# Patient Record
Sex: Female | Born: 1970 | Race: White | Hispanic: No | Smoking: Former smoker
Health system: Southern US, Community
[De-identification: ages and names within clinical notes are randomized; demographics above are authoritative.]

## PROBLEM LIST (undated history)

## (undated) DIAGNOSIS — G43909 Migraine, unspecified, not intractable, without status migrainosus: Secondary | ICD-10-CM

## (undated) DIAGNOSIS — R609 Edema, unspecified: Secondary | ICD-10-CM

## (undated) HISTORY — PX: TUBAL LIGATION: SHX77

## (undated) HISTORY — DX: Migraine, unspecified, not intractable, without status migrainosus: G43.909

---

## 2003-07-23 ENCOUNTER — Other Ambulatory Visit: Admission: RE | Admit: 2003-07-23 | Discharge: 2003-07-23 | Payer: Self-pay | Admitting: *Deleted

## 2006-07-07 ENCOUNTER — Emergency Department (HOSPITAL_COMMUNITY): Admission: EM | Admit: 2006-07-07 | Discharge: 2006-07-08 | Payer: Self-pay | Admitting: Emergency Medicine

## 2006-07-08 ENCOUNTER — Ambulatory Visit: Payer: Self-pay

## 2006-07-24 ENCOUNTER — Ambulatory Visit: Payer: Self-pay | Admitting: Cardiology

## 2006-08-06 ENCOUNTER — Encounter: Payer: Self-pay | Admitting: Cardiology

## 2006-08-06 ENCOUNTER — Ambulatory Visit: Payer: Self-pay

## 2006-08-19 ENCOUNTER — Ambulatory Visit: Payer: Self-pay | Admitting: Cardiology

## 2006-10-03 ENCOUNTER — Ambulatory Visit: Payer: Self-pay | Admitting: Cardiology

## 2009-09-22 ENCOUNTER — Ambulatory Visit (HOSPITAL_COMMUNITY): Admission: RE | Admit: 2009-09-22 | Discharge: 2009-09-22 | Payer: Self-pay | Admitting: Obstetrics and Gynecology

## 2011-01-09 LAB — HCG, SERUM, QUALITATIVE: Preg, Serum: NEGATIVE

## 2011-01-09 LAB — CBC
MCV: 91.6 fL (ref 78.0–100.0)
Platelets: 226 10*3/uL (ref 150–400)
RDW: 12.6 % (ref 11.5–15.5)
WBC: 7.4 10*3/uL (ref 4.0–10.5)

## 2011-02-23 NOTE — Assessment & Plan Note (Signed)
[facility]                              CARDIOLOGY OFFICE NOTE   ETHERINE, MACKOWIAK                       MRN:          [id number]  DATE:08/19/2006                            DOB:          01/15/1971    Helen Shannon is a pleasant, 40 year old female who I recently saw for  palpitations.  She had an event monitor that showed normal sinus rhythm with  PVCs.  TSH and magnesium were normal.  An echocardiogram was performed on  August 06, 2006.  Her LV function was normal and there was mild biatrial  enlargement.  We placed the patient on Toprol 25 mg p.o. daily, however, she  contacted [place] and stated that she felt hyper on the Toprol and was unable to  sleep due to her excess energy levels.  We stopped her Toprol and began  atenolol.  She feels that that may be increasing her blood pressure.  However, her palpitations have improved.  There is no dyspnea or exertional  chest pain.   Her medications include Prevacid 30 mg p.o. daily and Atenolol 25 mg p.o.  daily.   PHYSICAL EXAM:  Shows a blood pressure of 132/86 and her pulse is 70.  CHEST:  Clear  CARDIOVASCULAR:  Regular rate and rhythm.  EXTREMITIES:  No edema.   DIAGNOSIS:  Palpitations secondary to PVCs.   PLAN:  [name] continues to have palpitations but they are improved on  atenolol.  However, she is very concerned about atenolol causing high blood  pressure.  I explained to her that this typically is not the case and that  it is indeed used for an antihypertensive.  She was hesitant but we agreed  to increase her atenolol to 50 mg p.o. daily to see if her symptoms would  improve.  She will monitor her blood pressure at home.  If she develops  worsening side effects, then we could consider discontinuing her atenolol  and trying Cardizem 120 mg p.o. daily.  We will see her back in  approximately 6 weeks.     Madolyn Frieze Jens Som, MD, [facility]  Electronically Signed    BSC/MedQ  DD:  08/19/2006  DT: 08/19/2006  Job #: [id number]   cc:   Sheppard Penton. Stacie Acres, M.D.

## 2011-02-23 NOTE — Assessment & Plan Note (Signed)
[facility]                            CARDIOLOGY OFFICE NOTE   Helen Shannon, Helen Shannon                       MRN:          [id number]  DATE:10/03/2006                            DOB:          08-18-1971    Helen Shannon returns for followup today. Please refer to my previous  notes for details. Since I last saw her, she continues to have  occasional palpitations that are described as a brief skip with a mild  pain. There is no other dyspnea on exertion, orthopnea, PND, pedal  edema, exertional chest pain or syncope. Note she did increase her  atenolol to 50 mg p.o. daily but apparently did not tolerate this as she  felt increased fatigue and shortness of breath.   CURRENT MEDICATIONS:  1. Prevacid 30 mg p.o. daily.  2. Atenolol 25 mg p.o. daily.   PHYSICAL EXAMINATION:  VITAL SIGNS:  Blood pressure 128/82 and her pulse  is 68.  NECK:  Supple.  CHEST:  Clear.  CARDIOVASCULAR:  Reveals a regular rate and rhythm.  EXTREMITIES:  Show no edema.   DIAGNOSES:  Palpitations secondary to premature ventricular  contractions.   PLAN:  Helen Shannon continues to have palpitations that are not improved  with the atenolol. She states she did not tolerate the higher dose of  atenolol and has now failed that as well as Toprol. We discussed trying  another beta blocker today versus a calcium blocker and we have decided  to see if Cardizem 120 mg p.o. daily will provide any benefit. She will  discontinue her atenolol. I will see her back in approximately 4-6  weeks.     Madolyn Frieze Jens Som, MD, [facility]  Electronically Signed    BSC/MedQ  DD: 10/03/2006  DT: 10/03/2006  Job #: [id number]
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
## 2011-11-29 ENCOUNTER — Encounter (HOSPITAL_COMMUNITY): Payer: Self-pay

## 2011-11-29 ENCOUNTER — Other Ambulatory Visit: Payer: Self-pay

## 2011-11-29 ENCOUNTER — Emergency Department (HOSPITAL_COMMUNITY): Payer: BC Managed Care – PPO

## 2011-11-29 ENCOUNTER — Emergency Department (HOSPITAL_COMMUNITY)
Admission: EM | Admit: 2011-11-29 | Discharge: 2011-11-29 | Disposition: A | Discharge status: Home / Self Care | Payer: BC Managed Care – PPO | Attending: Emergency Medicine | Admitting: Emergency Medicine

## 2011-11-29 DIAGNOSIS — M79609 Pain in unspecified limb: Secondary | ICD-10-CM | POA: Insufficient documentation

## 2011-11-29 DIAGNOSIS — M79603 Pain in arm, unspecified: Secondary | ICD-10-CM

## 2011-11-29 DIAGNOSIS — M549 Dorsalgia, unspecified: Secondary | ICD-10-CM | POA: Insufficient documentation

## 2011-11-29 DIAGNOSIS — M542 Cervicalgia: Secondary | ICD-10-CM | POA: Insufficient documentation

## 2011-11-29 DIAGNOSIS — F17200 Nicotine dependence, unspecified, uncomplicated: Secondary | ICD-10-CM | POA: Insufficient documentation

## 2011-11-29 HISTORY — DX: Edema, unspecified: R60.9

## 2011-11-29 LAB — POCT I-STAT TROPONIN I

## 2011-11-29 MED ORDER — NAPROXEN 500 MG PO TABS: 500.0000 mg | ORAL_TABLET | Freq: Two times a day (BID) | ORAL | Status: AC

## 2011-11-29 MED ORDER — FENTANYL CITRATE 0.05 MG/ML IJ SOLN
50.0000 ug | Freq: Once | INTRAMUSCULAR | Status: AC
  Administered 2011-11-29: 50 ug via INTRAMUSCULAR
  Filled 2011-11-29: qty 2

## 2011-11-29 MED ORDER — DIAZEPAM 5 MG PO TABS: 5.0000 mg | ORAL_TABLET | Freq: Two times a day (BID) | ORAL | Status: AC

## 2011-11-29 MED ORDER — DIAZEPAM 5 MG PO TABS
5.0000 mg | ORAL_TABLET | Freq: Once | ORAL | Status: AC
  Administered 2011-11-29: 5 mg via ORAL
  Filled 2011-11-29: qty 1

## 2011-11-29 MED ORDER — KETOROLAC TROMETHAMINE 60 MG/2ML IM SOLN
60.0000 mg | Freq: Once | INTRAMUSCULAR | Status: AC
  Administered 2011-11-29: 60 mg via INTRAMUSCULAR
  Filled 2011-11-29: qty 2

## 2011-11-29 NOTE — ED Notes (Signed)
Pt states that at 0700 she had onset of severe stabbing upper back pain. She states that the pain is radiating down her left arm. She denies any other s/s. She states that the pain is sharp in nature. She took asa pta but is unsure of the dosage. Alert and oriented.

## 2011-11-29 NOTE — ED Notes (Signed)
Pt stated that she was not going to wait for sling she was going to go to another hospital and get one.

## 2011-11-29 NOTE — Discharge Instructions (Signed)
Please followup with your primary care Dr. or with an orthopedic Dr. for further evaluation of your symptoms. Take medication as prescribed do not drive or operate heavy machinery as there can cause drowsiness. Return to ER if you are having fever, shortness of breath, or chest pain.  RICE: Routine Care for Injuries The routine care of many injuries includes Rest, Ice, Compression, and Elevation (RICE). HOME CARE INSTRUCTIONS  Rest is needed to allow your body to heal. Routine activities can usually be resumed when comfortable. Injured tendons and bones can take up to 6 weeks to heal. Tendons are the cord-like structures that attach muscle to bone.   Ice following an injury helps keep the swelling down and reduces pain.   Put ice in a plastic bag.   Place a towel between your skin and the bag.   Leave the ice on for 15 to 20 minutes, 3 to 4 times a day. Do this while awake, for the first 24 to 48 hours. After that, continue as directed by your caregiver.   Compression helps keep swelling down. It also gives support and helps with discomfort. If an elastic bandage has been applied, it should be removed and reapplied every 3 to 4 hours. It should not be applied tightly, but firmly enough to keep swelling down. Watch fingers or toes for swelling, bluish discoloration, coldness, numbness, or excessive pain. If any of these problems occur, remove the bandage and reapply loosely. Contact your caregiver if these problems continue.   Elevation helps reduce swelling and decreases pain. With extremities, such as the arms, hands, legs, and feet, the injured area should be placed near or above the level of the heart, if possible.  SEEK IMMEDIATE MEDICAL CARE IF:  You have persistent pain and swelling.   You develop redness, numbness, or unexpected weakness.   Your symptoms are getting worse rather than improving after several days.  These symptoms may indicate that further evaluation or further X-rays  are needed. Sometimes, X-rays may not show a small broken bone (fracture) until 1 week or 10 days later. Make a follow-up appointment with your caregiver. Ask when your X-ray results will be ready. Make sure you get your X-ray results. Document Released: 01/06/2001 Document Revised: 06/06/2011 Document Reviewed: 02/23/2011 [facility] Patient Information 2012 [place], [place].

## 2011-11-29 NOTE — Progress Notes (Signed)
Orthopedic Tech Progress Note Patient Details:  Helen Shannon 09-20-1971 [id number]  Other Ortho Devices Ortho Device Location: arm sling Ortho Device Interventions: Application   Cammer, Mickie Bail 11/29/2011, 1:49 PM

## 2011-11-29 NOTE — ED Notes (Signed)
Patient discharged with prescriptions and written instructions

## 2011-11-29 NOTE — ED Provider Notes (Signed)
History     CSN: [id number]  Arrival date & time 11/29/11  1030   First MD Initiated Contact with Patient 11/29/11 1142      Chief Complaint  Patient presents with  . Back Pain    (Consider location/radiation/quality/duration/timing/severity/associated sxs/prior treatment) HPI  41 year old female presents with acute onset of severe stabbing upper back pain which started this morning. Pain is to the mid back radiating to her shoulder blade, and down her left arm. Patient noticed increasing pain if she has her left arm down, and pain improves if she raises her arm up above her head. Pain is persistent. She denies similar pain in the past. She denies any recent strenuous exercise. She denies fever, headache, chest pain, shortness of breath, abdominal pain. She denies any rash or any recent trauma.  Past Medical History  Diagnosis Date  . Fluid retention     History reviewed. No pertinent past surgical history.  History reviewed. No pertinent family history.  History  Substance Use Topics  . Smoking status: Current Everyday Smoker -- 0.5 packs/day  . Smokeless tobacco: Not on file  . Alcohol Use: No    OB History    Grav Para Term Preterm Abortions TAB SAB Ect Mult Living                  Review of Systems  All other systems reviewed and are negative.    Allergies  Codeine  Home Medications   Current Outpatient Rx  Name Route Sig Dispense Refill  . ASPIRIN 325 MG PO TABS Oral Take 325 mg by mouth once. For pain.    Marland Kitchen HYDROCHLOROTHIAZIDE 25 MG PO TABS Oral Take 25 mg by mouth daily.    . IBUPROFEN 200 MG PO TABS Oral Take 400-800 mg by mouth every 6 (six) hours as needed. For pain.      BP 94/62  Pulse 90  Temp(Src) 98 F (36.7 C) (Oral)  Resp 20  SpO2 100%  Physical Exam  Nursing note and vitals reviewed. Constitutional: She appears well-developed and well-nourished.       Morbidly obese patient, appears tearful.  HENT:  Head: Normocephalic and  atraumatic.  Eyes: Conjunctivae are normal.  Neck: Normal range of motion. Neck supple.       No significant pain with axial loading.  Cardiovascular: Normal rate, regular rhythm and normal heart sounds.   Pulmonary/Chest: Effort normal and breath sounds normal. No respiratory distress. She exhibits no tenderness.       Distant breath sounds, likely secondary to body habitus.  Abdominal: Soft.  Musculoskeletal:       Right shoulder: Normal.       Left shoulder: She exhibits decreased range of motion and tenderness. She exhibits no bony tenderness, no swelling, no effusion and no deformity.       Right elbow: Normal.      Left elbow: Normal.       Cervical back: Normal.       Thoracic back: Normal.       Lumbar back: Normal.    ED Course  Procedures (including critical care time)  Labs Reviewed - No data to display No results found.   No diagnosis found.   Date: 11/29/2011  Rate: 92  Rhythm: normal sinus rhythm  QRS Axis: normal  Intervals: normal  ST/T Wave abnormalities: normal  Conduction Disutrbances:none  Narrative Interpretation:   Old EKG Reviewed: none available    MDM  Patient with mid upper back pain,  with no focal point tenderness. Pain seems to worsen when her left arm is down and improves with arm raise. Her left shoulder evaluation was unremarkable. She does not have any significant neck pain. She denies chest pain or shortness of breath. Her pain is likely musculoskeletal, as it is reproducible with arm movement. Will also consider cardiac, pulmonary etiology. Aortic dissection, pneumonia, PE, pneumothorax, ACS are less likely. Pt is PERC negative.  Will obtain EKG, troponin, chest x-ray. Will give patient Toradol, and muscle relaxant.  12:58 PM ECG is unremarkable.  CXR shows no acute finding.  However, pt sts her pain has not improved with toradol and valium.  She is allergic to Codeine, Fentanyl IM given.  I discussed care with my attending who  agrees with plan.        Fayrene Helper, PA-C 11/29/11 1331

## 2011-11-29 NOTE — ED Notes (Signed)
Ortho paged. 

## 2011-12-03 NOTE — ED Provider Notes (Signed)
History/physical exam/procedure(s) were performed by non-physician practitioner and as supervising physician I was immediately available for consultation/collaboration. I have reviewed all notes and am in agreement with care and plan.   Hilario Quarry, MD 12/03/11 [phone]

## 2012-08-25 ENCOUNTER — Ambulatory Visit: Payer: BC Managed Care – PPO | Attending: Orthopedic Surgery | Admitting: Physical Therapy

## 2012-08-25 DIAGNOSIS — R5381 Other malaise: Secondary | ICD-10-CM | POA: Insufficient documentation

## 2012-08-25 DIAGNOSIS — M25669 Stiffness of unspecified knee, not elsewhere classified: Secondary | ICD-10-CM | POA: Insufficient documentation

## 2012-08-25 DIAGNOSIS — IMO0001 Reserved for inherently not codable concepts without codable children: Secondary | ICD-10-CM | POA: Insufficient documentation

## 2012-08-25 DIAGNOSIS — M25569 Pain in unspecified knee: Secondary | ICD-10-CM | POA: Insufficient documentation

## 2012-08-29 ENCOUNTER — Ambulatory Visit: Payer: BC Managed Care – PPO | Admitting: Physical Therapy

## 2012-09-02 ENCOUNTER — Ambulatory Visit: Payer: BC Managed Care – PPO | Admitting: *Deleted

## 2012-09-08 ENCOUNTER — Ambulatory Visit: Payer: BC Managed Care – PPO | Attending: Orthopedic Surgery | Admitting: Physical Therapy

## 2012-09-08 DIAGNOSIS — IMO0001 Reserved for inherently not codable concepts without codable children: Secondary | ICD-10-CM | POA: Insufficient documentation

## 2012-09-08 DIAGNOSIS — M25669 Stiffness of unspecified knee, not elsewhere classified: Secondary | ICD-10-CM | POA: Insufficient documentation

## 2012-09-08 DIAGNOSIS — M25569 Pain in unspecified knee: Secondary | ICD-10-CM | POA: Insufficient documentation

## 2012-09-08 DIAGNOSIS — R5381 Other malaise: Secondary | ICD-10-CM | POA: Insufficient documentation

## 2012-09-11 ENCOUNTER — Ambulatory Visit: Payer: BC Managed Care – PPO | Admitting: Physical Therapy

## 2012-09-17 ENCOUNTER — Ambulatory Visit: Payer: BC Managed Care – PPO | Admitting: Physical Therapy

## 2012-09-22 ENCOUNTER — Ambulatory Visit: Payer: BC Managed Care – PPO | Admitting: Physical Therapy

## 2012-09-26 ENCOUNTER — Encounter: Payer: BC Managed Care – PPO | Admitting: Physical Therapy

## 2013-02-14 IMAGING — CR DG CHEST 2V
2 series · 2 of 2 positions shown · non-contrast
Comparison: Chest x-ray 07/08/2006.

CLINICAL DATA: Back, neck and left arm pain.

CHEST - 2 VIEW

[w chest pa]
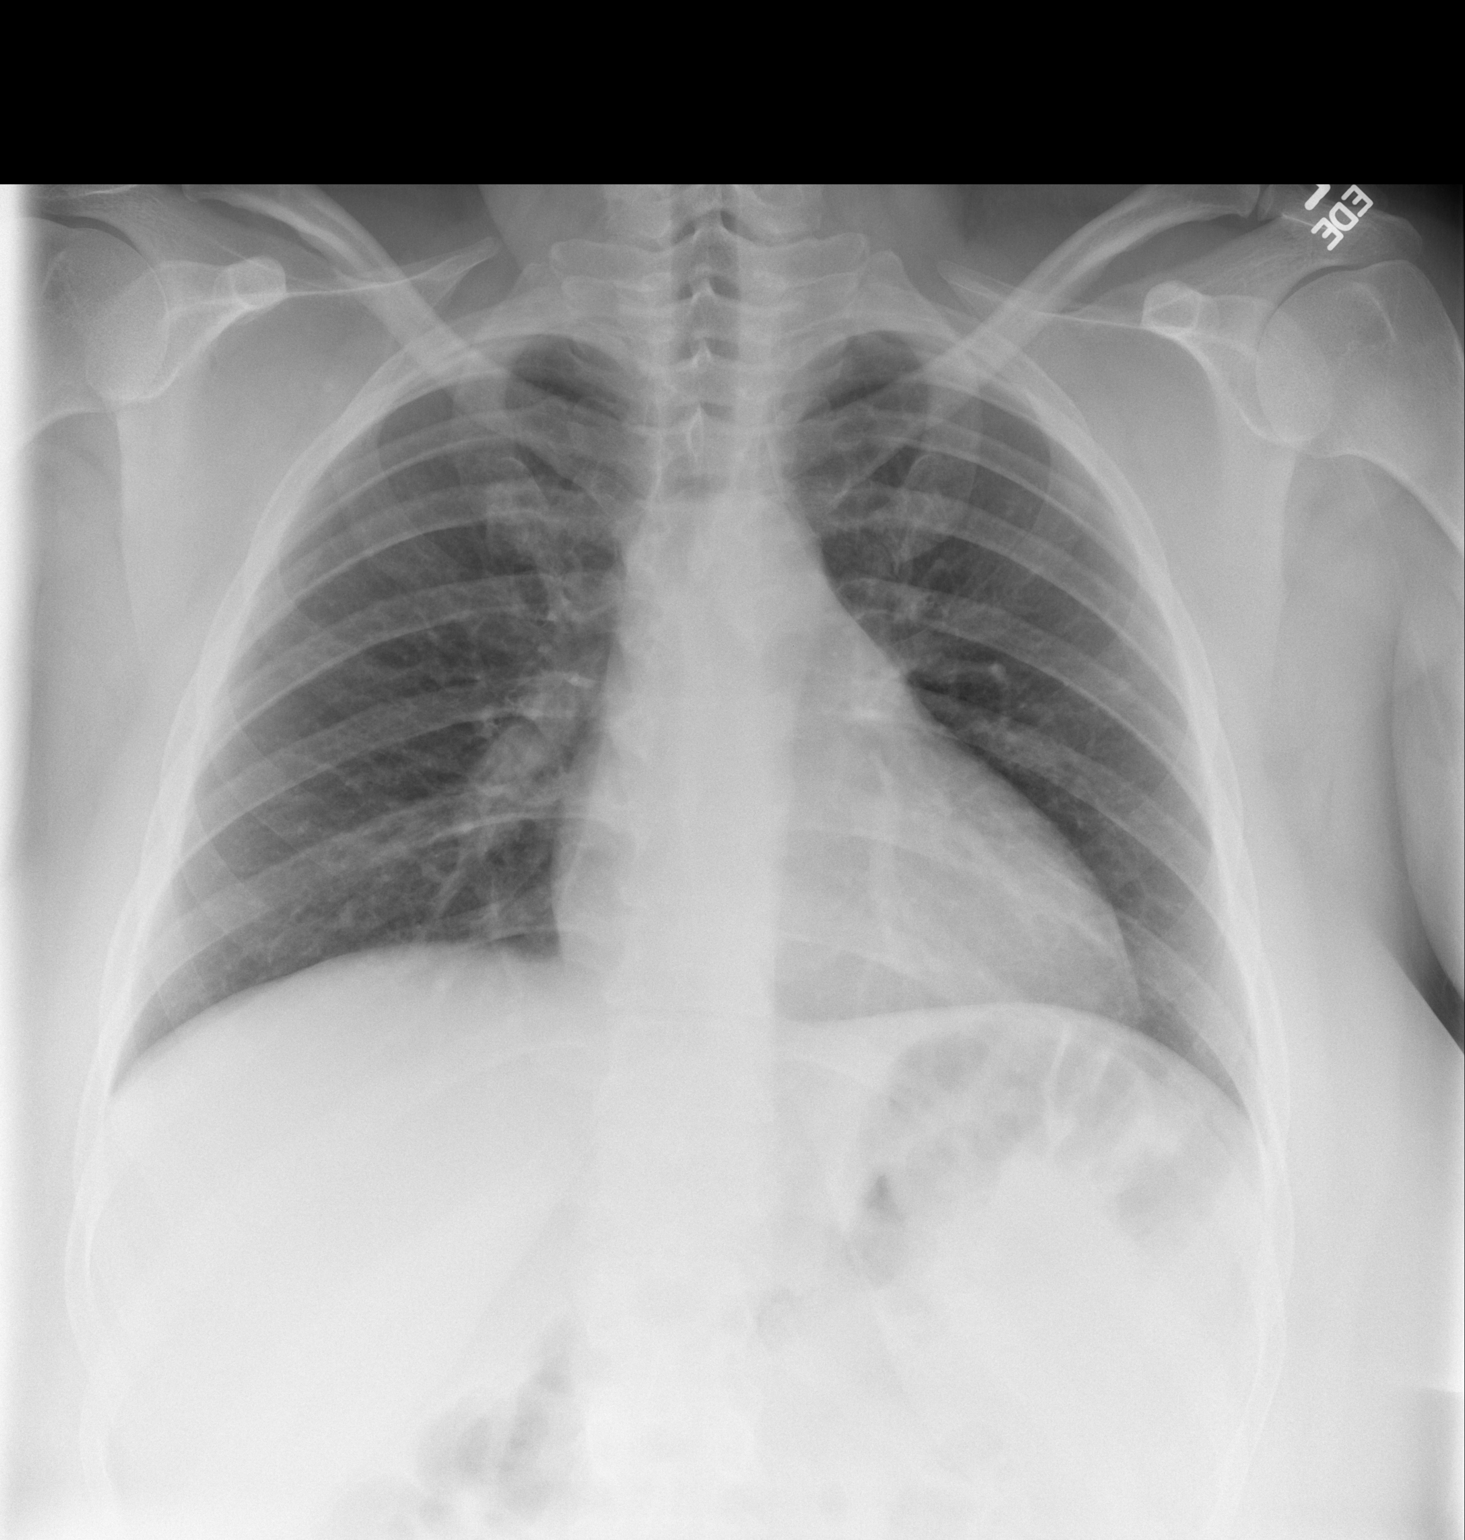

[w chest lat]
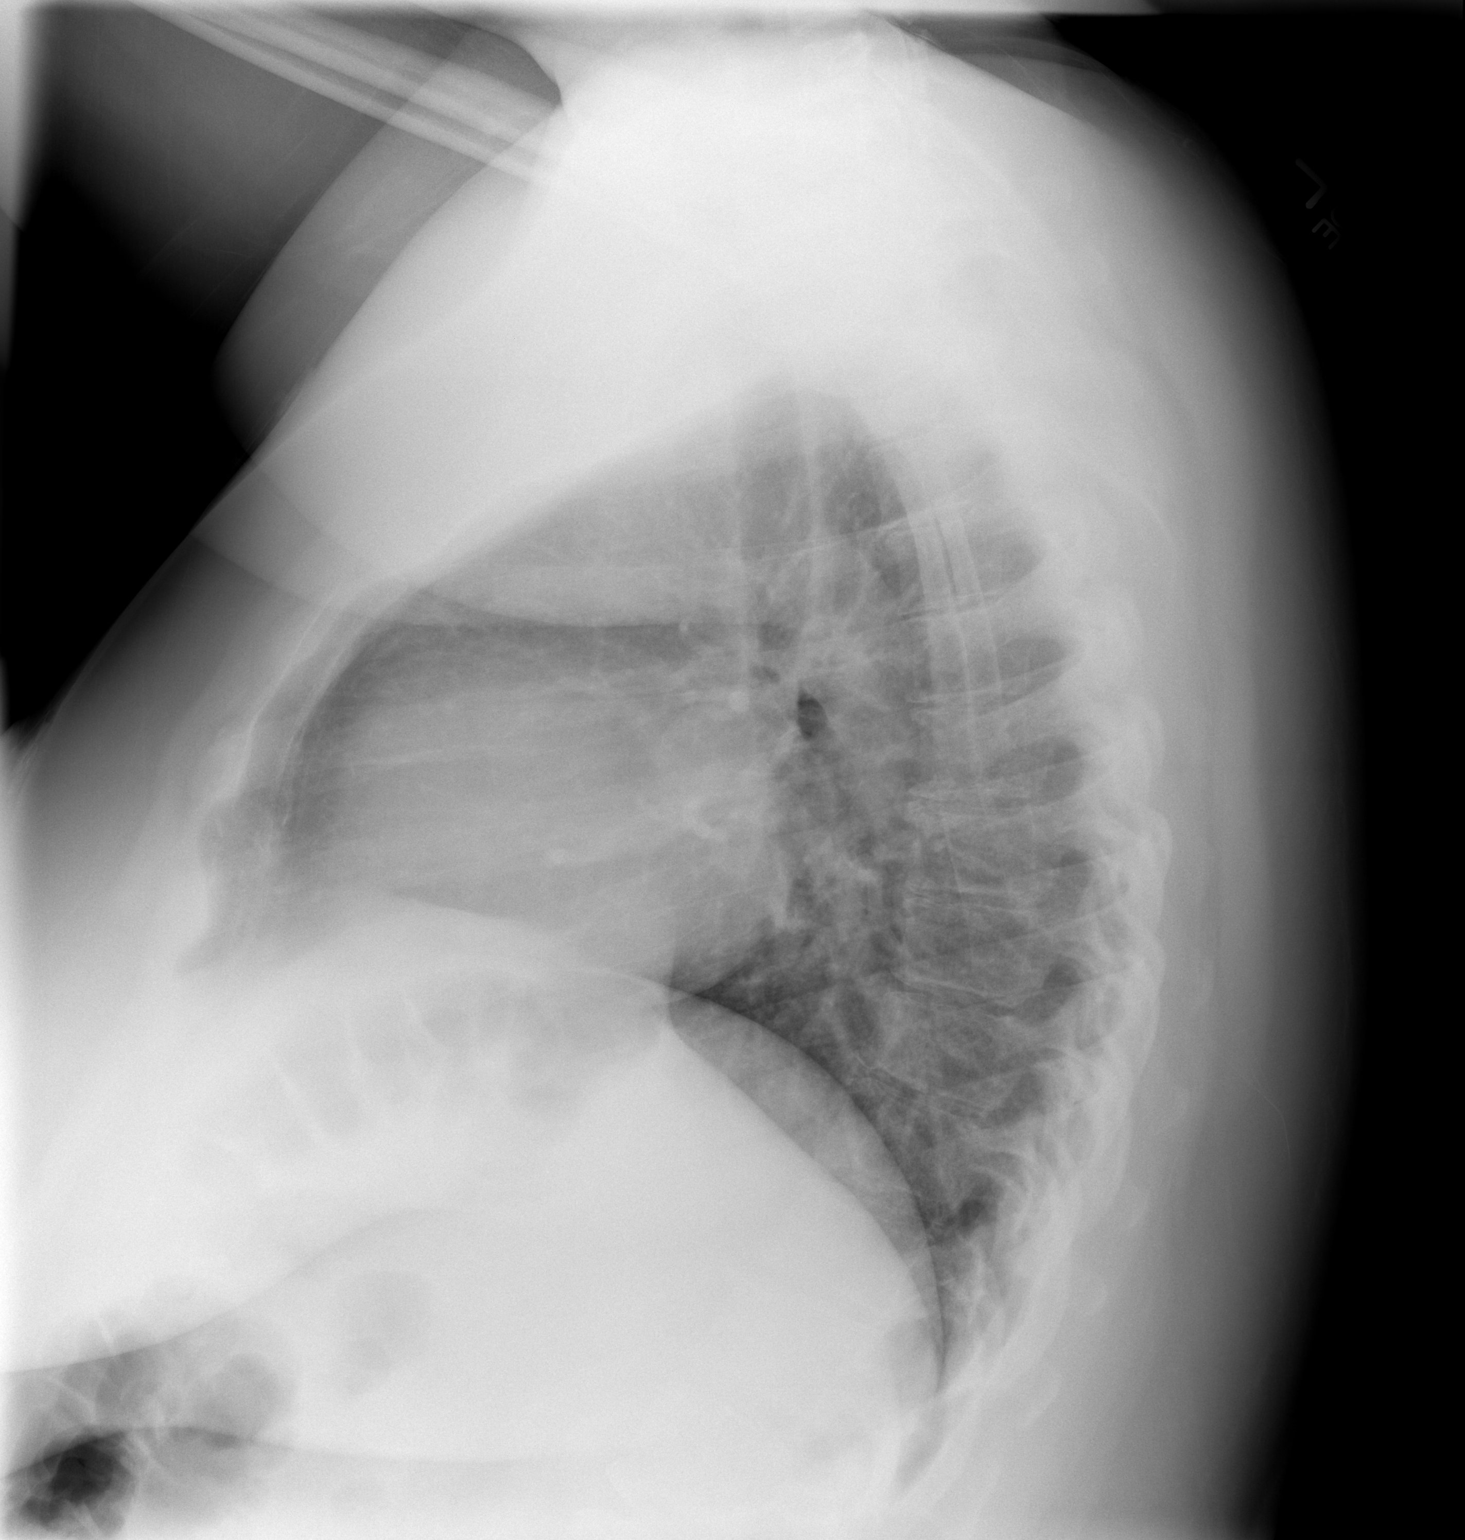

[2 of 2 positions shown; findings below may reference images not displayed]

FINDINGS: Lung volumes are normal.  No consolidative airspace
disease.  No pleural effusions.  No pneumothorax.  No pulmonary
nodule or mass noted.  Pulmonary vasculature and the
cardiomediastinal silhouette are within normal limits.
IMPRESSION: 1. No radiographic evidence of acute cardiopulmonary disease.

## 2015-05-19 ENCOUNTER — Other Ambulatory Visit: Payer: Self-pay | Admitting: Obstetrics and Gynecology

## 2016-01-23 ENCOUNTER — Ambulatory Visit: Payer: BLUE CROSS/BLUE SHIELD | Attending: Orthopaedic Surgery | Admitting: Physical Therapy

## 2016-01-23 DIAGNOSIS — M25612 Stiffness of left shoulder, not elsewhere classified: Secondary | ICD-10-CM | POA: Diagnosis present

## 2016-01-23 DIAGNOSIS — M25512 Pain in left shoulder: Secondary | ICD-10-CM
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
## 2016-01-23 NOTE — Patient Instructions (Signed)
Instructed patient in scar massage and gentle passive cane exercise into ER.

## 2016-01-25 ENCOUNTER — Ambulatory Visit: Payer: BLUE CROSS/BLUE SHIELD | Admitting: Physical Therapy

## 2016-01-25 ENCOUNTER — Encounter: Payer: Self-pay | Admitting: Physical Therapy

## 2016-01-25 DIAGNOSIS — M25512 Pain in left shoulder: Secondary | ICD-10-CM

## 2016-01-25 DIAGNOSIS — M25612 Stiffness of left shoulder, not elsewhere classified: Secondary | ICD-10-CM

## 2016-01-25 NOTE — Therapy (Signed)
[facility] [address] [place], [place], [address] Phone: [phone]   Fax:  [phone]  Physical Therapy Treatment  Patient Details  Name: Helen Shannon MRN: [id number] Date of Birth: 1971/08/03 Referring Provider: Annabelle Harman Bifolck PA-C.  Encounter Date: 01/25/2016      PT End of Session - 01/25/16 1310    Visit Number 2   Number of Visits 16   Date for PT Re-Evaluation 03/19/16   PT Start Time 1235   PT Stop Time 1325   PT Time Calculation (min) 50 min   Activity Tolerance Patient tolerated treatment well   Behavior During Therapy [facility] for tasks assessed/performed      Past Medical History  Diagnosis Date  . Fluid retention     History reviewed. No pertinent past surgical history.  There were no vitals filed for this visit.      Subjective Assessment - 01/25/16 1242    Subjective Patient had no complaints after last treatment   Patient Stated Goals Use my left shoulder again.   Currently in Pain? Yes   Pain Score 4    Pain Location Shoulder   Pain Orientation Left   Pain Descriptors / Indicators Aching;Tender   Pain Type Surgical pain   Pain Onset 1 to 4 weeks ago   Pain Frequency Constant   Aggravating Factors  movement of shoulder   Pain Relieving Factors rest                         [facility] Adult PT Treatment/Exercise - 01/25/16 0001    Modalities   Modalities Cryotherapy   Cryotherapy   Number Minutes Cryotherapy 15 Minutes   Cryotherapy Location Shoulder   Type of Cryotherapy Ice pack   Electrical Stimulation   Electrical Stimulation Location Left shoulder   Electrical Stimulation Action IFC   Electrical Stimulation Parameters 80-150hz    Electrical Stimulation Goals Pain   Manual Therapy   Manual Therapy Passive ROM   Passive ROM gentle PROM for left shoulder flexion within 90 degrees per MD and ER                     PT Long Term Goals - 01/23/16 1554    PT LONG TERM  GOAL #2   Title Active left shoulder flexion to 145 degrees so the patient can easily reach overhead   Time 8   Period Weeks   Status New   PT LONG TERM GOAL #3   Title Active ER to 70 degrees+ to allow for easily donning/doffing of apparel   Time 8   Period Weeks   Status New   PT LONG TERM GOAL #4   Title Increase ROM so patient is able to reach behind back to L4 with left hand.   Time 8   Period Weeks   Status New   PT LONG TERM GOAL #5   Title Increase left shoulder strength to a solid 4/5 to increase stability for performance of functional activities   Time 8   Period Weeks   Status New               Plan - 01/25/16 1311    Clinical Impression Statement Patient progressing with PROM today, patient has no increased pain and little guarding. Patient reports doing pendulum and other self AAROM exercises per MD and MPT. Goals ongoing due to healing and MD orders    Rehab Potential Good   PT  Frequency 2x / week   PT Duration 8 weeks   PT Treatment/Interventions ADLs/Self Care Home Management;Electrical Stimulation;Moist Heat;Therapeutic exercise;Therapeutic activities;Neuromuscular re-education;Manual techniques;Passive range of motion   PT Next Visit Plan Gentle left shoulder PROM:  MD order:  PROM to 90 degrees.  AROM left elbow.  STW/M scar massage.     Consulted and Agree with Plan of Care Patient      Patient will benefit from skilled therapeutic intervention in order to improve the following deficits and impairments:  Pain, Decreased activity tolerance, Decreased range of motion  Visit Diagnosis: Pain in left shoulder  Stiffness of left shoulder, not elsewhere classified     Problem List There are no active problems to display for this patient.   Hermelinda DellenDUNFORD, Samanvi Cuccia P, PTA 01/25/2016, 1:28 PM  [facility] [address] [place], [place], [address] Phone: [phone]   Fax:  [phone]  Name: Helen Shannon MRN: [id number] Date of Birth: 02/17/1971

## 2016-01-27 ENCOUNTER — Ambulatory Visit: Payer: BLUE CROSS/BLUE SHIELD | Admitting: Physical Therapy

## 2016-01-27 ENCOUNTER — Encounter: Payer: Self-pay | Admitting: Physical Therapy

## 2016-01-27 DIAGNOSIS — M25612 Stiffness of left shoulder, not elsewhere classified: Secondary | ICD-10-CM

## 2016-01-27 DIAGNOSIS — M25512 Pain in left shoulder: Secondary | ICD-10-CM | POA: Diagnosis not present

## 2016-01-27 NOTE — Therapy (Signed)
[facility] [address] [place], [place], [address] Phone: [phone]   Fax:  [phone]  Physical Therapy Treatment  Patient Details  Name: Helen Shannon MRN: [id number] Date of Birth: 11/27/1970 Referring Provider: Annabelle Harmanana Bifolck PA-C.  Encounter Date: 01/27/2016      PT End of Session - 01/27/16 0850    Visit Number 3   Number of Visits 16   Date for PT Re-Evaluation 03/19/16   PT Start Time 0823   PT Stop Time 0905   PT Time Calculation (min) 42 min   Activity Tolerance Patient tolerated treatment well   Behavior During Therapy [facility] for tasks assessed/performed      Past Medical History  Diagnosis Date  . Fluid retention     No past surgical history on file.  There were no vitals filed for this visit.      Subjective Assessment - 01/27/16 0822    Subjective (p) States that shoulder is sore today and may have overdone activity yesterday.   Patient Stated Goals (p) Use my left shoulder again.   Currently in Pain? (p) Yes   Pain Score (p) 2    Pain Location (p) Shoulder   Pain Orientation (p) Left   Pain Descriptors / Indicators (p) Sore   Pain Type (p) Surgical pain   Pain Onset (p) 1 to 4 weeks ago            [facility] - 01/27/16 0001    Assessment   Medical Diagnosis Left proximal humerus fracture.   Onset Date/Surgical Date 01/06/16   Next MD Visit 02/08/2016   Restrictions   Weight Bearing Restrictions No                     [facility] Adult PT Treatment/Exercise - 01/27/16 0001    Modalities   Modalities Electrical Stimulation;Cryotherapy   Cryotherapy   Number Minutes Cryotherapy 15 Minutes   Cryotherapy Location Shoulder   Type of Cryotherapy Ice pack   Electrical Stimulation   Electrical Stimulation Location Left shoulder   Electrical Stimulation Action IFC   Electrical Stimulation Parameters 80-150 Hz x15 min   Electrical Stimulation Goals Pain   Manual Therapy   Manual  Therapy Passive ROM;Myofascial release   Myofascial Release MFR/STW to L UT/Levator Scapula and posterior humerus to decrease tightness and discomfort   Passive ROM Gentle PROM of L shoulder into flexion to 90 deg and very gentle ER with frequent oscillations for relaxation                     PT Long Term Goals - 01/23/16 1554    PT LONG TERM GOAL #2   Title Active left shoulder flexion to 145 degrees so the patient can easily reach overhead   Time 8   Period Weeks   Status New   PT LONG TERM GOAL #3   Title Active ER to 70 degrees+ to allow for easily donning/doffing of apparel   Time 8   Period Weeks   Status New   PT LONG TERM GOAL #4   Title Increase ROM so patient is able to reach behind back to L4 with left hand.   Time 8   Period Weeks   Status New   PT LONG TERM GOAL #5   Title Increase left shoulder strength to a solid 4/5 to increase stability for performance of functional activities   Time 8   Period Weeks   Status  New               Plan - 01/27/16 0855    Clinical Impression Statement Patient tolerated today's treatment well today with only reports of catching with lowering of LUE. Frequent oscillations were required to promote relaxation due to L shoulder discomfort and catching. Patient continues to experience L elbow pain and posterior shoulder musculature tightness. Manual therapy was completed to L UT, Levator Scapula region as well as posterior humerus to decrease tightness. Normal modalites response noted following removal of the modaliites. Patient continues to report completed of AAROM exercises and pendulums per MPT and MD. Patient reported decreased  L shoulder discomfort following today's treatment.   Rehab Potential Good   PT Frequency 2x / week   PT Duration 8 weeks   PT Treatment/Interventions ADLs/Self Care Home Management;Electrical Stimulation;Moist Heat;Therapeutic exercise;Therapeutic activities;Neuromuscular re-education;Manual  techniques;Passive range of motion   PT Next Visit Plan Gentle left shoulder PROM:  MD order:  PROM to 90 degrees.  AROM left elbow.  STW/M scar massage.     Consulted and Agree with Plan of Care Patient      Patient will benefit from skilled therapeutic intervention in order to improve the following deficits and impairments:  Pain, Decreased activity tolerance, Decreased range of motion  Visit Diagnosis: Pain in left shoulder  Stiffness of left shoulder, not elsewhere classified     Problem List There are no active problems to display for this patient.   Evelene Croon, PTA 01/27/2016, 9:11 AM  [facility] [address] [place], [place], [address] Phone: [phone]   Fax:  [phone]  Name: Helen Shannon MRN: [id number] Date of Birth: 08-28-1971

## 2016-01-30 ENCOUNTER — Ambulatory Visit: Payer: BLUE CROSS/BLUE SHIELD | Admitting: Physical Therapy

## 2016-01-30 DIAGNOSIS — M25612 Stiffness of left shoulder, not elsewhere classified: Secondary | ICD-10-CM

## 2016-01-30 DIAGNOSIS — M25512 Pain in left shoulder: Secondary | ICD-10-CM

## 2016-01-30 NOTE — Therapy (Signed)
[facility] [address] [place], [place], [address] Phone: [phone]   Fax:  [phone]  Physical Therapy Treatment  Patient Details  Name: Helen CongoGloria Lemar MRN: [id number] Date of Birth: 08/04/1971 Referring Provider: Annabelle Harmanana Bifolck PA-C.  Encounter Date: 01/30/2016      PT End of Session - 01/30/16 1349    Visit Number 4   Number of Visits 16   Date for PT Re-Evaluation 03/19/16   PT Start Time 1349   PT Stop Time 1455   PT Time Calculation (min) 66 min   Activity Tolerance Patient tolerated treatment well   Behavior During Therapy [facility] for tasks assessed/performed      Past Medical History  Diagnosis Date  . Fluid retention     No past surgical history on file.  There were no vitals filed for this visit.      Subjective Assessment - 01/30/16 1349    Subjective Patient just reports some twinges here and there. Patient having difficulty sleeping due to pain. Wakes with arm throbbing every morning 8-9/10.   Patient Stated Goals Use my left shoulder again.   Currently in Pain? Yes   Pain Score 3    Pain Location Shoulder   Pain Orientation Left   Pain Descriptors / Indicators Aching;Tender   Pain Type Surgical pain   Pain Onset More than a month ago   Pain Frequency Constant   Aggravating Factors  sleeping and moving shoulder                         [facility] Adult PT Treatment/Exercise - 01/30/16 0001    Exercises   Exercises Shoulder   Shoulder Exercises: Seated   Retraction 10 reps   Shoulder Exercises: Stretch   Other Shoulder Stretches neck stretches for UT, lev scapula   Modalities   Modalities Electrical Stimulation;Cryotherapy   Cryotherapy   Number Minutes Cryotherapy 15 Minutes   Cryotherapy Location Shoulder   Type of Cryotherapy Ice pack   Electrical Stimulation   Electrical Stimulation Location L shoulder   Electrical Stimulation Action IFC   Electrical Stimulation Parameters 80-150  Hz to tolerance x 15 min   Electrical Stimulation Goals Pain   Manual Therapy   Manual Therapy Passive ROM;Soft tissue mobilization;Myofascial release   Soft tissue mobilization to L pecs, UT, scaular muscles   Myofascial Release to incision and surrounding tissues   Passive ROM Gentle PROM of L shoulder into flexion to 90 deg and very gentle ER with frequent oscillations for relaxation                     PT Long Term Goals - 01/23/16 1554    PT LONG TERM GOAL #2   Title Active left shoulder flexion to 145 degrees so the patient can easily reach overhead   Time 8   Period Weeks   Status New   PT LONG TERM GOAL #3   Title Active ER to 70 degrees+ to allow for easily donning/doffing of apparel   Time 8   Period Weeks   Status New   PT LONG TERM GOAL #4   Title Increase ROM so patient is able to reach behind back to L4 with left hand.   Time 8   Period Weeks   Status New   PT LONG TERM GOAL #5   Title Increase left shoulder strength to a solid 4/5 to increase stability for performance of functional activities  Time 8   Period Weeks   Status New               Plan - 01/30/16 1549    Clinical Impression Statement Patient did well with ROM and STW today. She has tightness in L pecs and posterior shoulder muscles. Suggestions given to patient regarding sleeping position as she continues to awake in pain. Goals are ongoing.   Rehab Potential Good   PT Frequency 2x / week   PT Duration 8 weeks   PT Treatment/Interventions ADLs/Self Care Home Management;Electrical Stimulation;Moist Heat;Therapeutic exercise;Therapeutic activities;Neuromuscular re-education;Manual techniques;Passive range of motion   PT Next Visit Plan Gentle left shoulder PROM:  MD order:  PROM to 90 degrees.  AROM left elbow.  STW/M scar massage.     Consulted and Agree with Plan of Care Patient      Patient will benefit from skilled therapeutic intervention in order to improve the following  deficits and impairments:  Pain, Decreased activity tolerance, Decreased range of motion  Visit Diagnosis: Pain in left shoulder  Stiffness of left shoulder, not elsewhere classified     Problem List There are no active problems to display for this patient.   Solon Palm PT  01/30/2016, 3:52 PM  [facility] [address] [place], [place], [address] Phone: [phone]   Fax:  [phone]  Name: Lujuana Kapler MRN: [id number] Date of Birth: 07/29/1971

## 2016-02-01 ENCOUNTER — Ambulatory Visit: Payer: BLUE CROSS/BLUE SHIELD | Admitting: Physical Therapy

## 2016-02-01 DIAGNOSIS — M25612 Stiffness of left shoulder, not elsewhere classified: Secondary | ICD-10-CM

## 2016-02-01 DIAGNOSIS — M25512 Pain in left shoulder: Secondary | ICD-10-CM

## 2016-02-01 NOTE — Therapy (Signed)
[facility] [address] [place], [place], [address] Phone: [phone]   Fax:  [phone]  Physical Therapy Treatment  Patient Details  Name: Alasha Mcguinness MRN: [id number] Date of Birth: 27-Aug-1971 Referring Provider: Annabelle Harman Bifolck PA-C.  Encounter Date: 02/01/2016      PT End of Session - 02/01/16 1348    Visit Number 5   Number of Visits 16   Date for PT Re-Evaluation 03/19/16   PT Start Time 1350   PT Stop Time 1432   PT Time Calculation (min) 42 min   Activity Tolerance Patient tolerated treatment well   Behavior During Therapy [facility] for tasks assessed/performed      Past Medical History  Diagnosis Date  . Fluid retention     No past surgical history on file.  There were no vitals filed for this visit.      Subjective Assessment - 02/01/16 1436    Subjective Reports increased soreness in L shoulder today.   Patient Stated Goals Use my left shoulder again.   Currently in Pain? Yes   Pain Score 8    Pain Location Shoulder   Pain Orientation Left   Pain Descriptors / Indicators Sore   Pain Type Surgical pain   Pain Onset More than a month ago            [facility] PT Assessment - 02/01/16 0001    Assessment   Medical Diagnosis Left proximal humerus fracture.   Onset Date/Surgical Date 01/06/16   Next MD Visit 02/08/2016   Restrictions   Weight Bearing Restrictions No                     [facility] Adult PT Treatment/Exercise - 02/01/16 0001    Modalities   Modalities Electrical Stimulation;Cryotherapy   Cryotherapy   Number Minutes Cryotherapy 15 Minutes   Cryotherapy Location Shoulder   Type of Cryotherapy Ice pack   Electrical Stimulation   Electrical Stimulation Location L shoulder   Electrical Stimulation Action IFC   Electrical Stimulation Parameters 1-10 hz x15 min   Electrical Stimulation Goals Pain   Manual Therapy   Manual Therapy Passive ROM   Passive ROM Gentle PROM of L shoulder into  flexion to 90 deg and very gentle ER with frequent oscillations for relaxation                     PT Long Term Goals - 02/01/16 1426    PT LONG TERM GOAL #1   Title Ind with a HEP.   Time 8   Period Weeks   Status On-going   PT LONG TERM GOAL #2   Title Active left shoulder flexion to 145 degrees so the patient can easily reach overhead   Time 8   Period Weeks   Status On-going   PT LONG TERM GOAL #3   Title Active ER to 70 degrees+ to allow for easily donning/doffing of apparel   Time 8   Period Weeks   Status On-going   PT LONG TERM GOAL #4   Title Increase ROM so patient is able to reach behind back to L4 with left hand.   Time 8   Period Weeks   Status On-going   PT LONG TERM GOAL #5   Title Increase left shoulder strength to a solid 4/5 to increase stability for performance of functional activities   Time 8   Period Weeks   Status On-going  Plan - 02/01/16 1534    Clinical Impression Statement Patient tolerated today's treatment fairly well today as she arrived at clinic with increased L shoulder sorenss from previous treatment. Firm end feels noted for both PROM of L shoulder flexion and ER. Smooth arc of motion noted into PROM L shoulder flexion although patient reported intermittant catching sensation upon lowering into neutral postiion. Frequent oscillations were required during L shoulder PROM to promote relaxation of the LUE. Normal modalites response noted following removal of the modalities. Goals remain on-going secondary to protocol limitations at this time. Patient experienced 6/10 L shoulder pain following today's treatment.   Rehab Potential Good   PT Frequency 2x / week   PT Duration 8 weeks   PT Treatment/Interventions ADLs/Self Care Home Management;Electrical Stimulation;Moist Heat;Therapeutic exercise;Therapeutic activities;Neuromuscular re-education;Manual techniques;Passive range of motion   PT Next Visit Plan Gentle  left shoulder PROM:  MD order:  PROM to 90 degrees.  AROM left elbow.  STW/M scar massage.     Consulted and Agree with Plan of Care Patient      Patient will benefit from skilled therapeutic intervention in order to improve the following deficits and impairments:  Pain, Decreased activity tolerance, Decreased range of motion  Visit Diagnosis: Pain in left shoulder  Stiffness of left shoulder, not elsewhere classified     Problem List There are no active problems to display for this patient.   Evelene CroonKelsey M Parsons, PTA 02/01/2016, 3:39 PM  [facility] [address] [place], [place], [address] Phone: [phone]   Fax:  [phone]  Name: Elgie CongoGloria Caprio MRN: [id number] Date of Birth: 02/15/1971

## 2016-02-03 ENCOUNTER — Encounter: Payer: BLUE CROSS/BLUE SHIELD | Admitting: Physical Therapy

## 2016-02-06 ENCOUNTER — Ambulatory Visit: Payer: BLUE CROSS/BLUE SHIELD | Attending: Orthopaedic Surgery | Admitting: Physical Therapy

## 2016-02-06 ENCOUNTER — Encounter: Payer: Self-pay | Admitting: Physical Therapy

## 2016-02-06 DIAGNOSIS — M25612 Stiffness of left shoulder, not elsewhere classified: Secondary | ICD-10-CM | POA: Diagnosis present

## 2016-02-06 DIAGNOSIS — M25512 Pain in left shoulder: Secondary | ICD-10-CM

## 2016-02-06 NOTE — Therapy (Signed)
[facility] [address] [place], [place], [address] Phone: [phone]   Fax:  [phone]  Physical Therapy Treatment  Patient Details  Name: Helen Shannon MRN: [id number] Date of Birth: 09/12/1971 Referring Provider: Annabelle Harmanana Bifolck PA-C.  Encounter Date: 02/06/2016      PT End of Session - 02/06/16 1111    Visit Number 6   Number of Visits 16   Date for PT Re-Evaluation 03/19/16   PT Start Time 1040   PT Stop Time 1126   PT Time Calculation (min) 46 min   Activity Tolerance Patient tolerated treatment well   Behavior During Therapy [facility] for tasks assessed/performed      Past Medical History  Diagnosis Date  . Fluid retention     History reviewed. No pertinent past surgical history.  There were no vitals filed for this visit.      Subjective Assessment - 02/06/16 1044    Subjective Patients has some minor soreness and stiffness   Patient Stated Goals Use my left shoulder again.   Currently in Pain? Yes   Pain Score 3    Pain Location Shoulder   Pain Orientation Left   Pain Descriptors / Indicators Sore   Pain Type Surgical pain   Pain Onset More than a month ago   Pain Frequency Constant   Aggravating Factors  movement   Pain Relieving Factors rest                         [facility] Adult PT Treatment/Exercise - 02/06/16 0001    Cryotherapy   Number Minutes Cryotherapy 15 Minutes   Cryotherapy Location Shoulder   Type of Cryotherapy Ice pack   Electrical Stimulation   Electrical Stimulation Location L shoulder   Electrical Stimulation Action IFC   Electrical Stimulation Parameters 1-10hz    Electrical Stimulation Goals Pain   Manual Therapy   Manual Therapy Passive ROM   Passive ROM Gentle PROM of L shoulder into flexion to 90 deg and very gentle ER with frequent oscillations for relaxation                     PT Long Term Goals - 02/01/16 1426    PT LONG TERM GOAL #1   Title Ind  with a HEP.   Time 8   Period Weeks   Status On-going   PT LONG TERM GOAL #2   Title Active left shoulder flexion to 145 degrees so the patient can easily reach overhead   Time 8   Period Weeks   Status On-going   PT LONG TERM GOAL #3   Title Active ER to 70 degrees+ to allow for easily donning/doffing of apparel   Time 8   Period Weeks   Status On-going   PT LONG TERM GOAL #4   Title Increase ROM so patient is able to reach behind back to L4 with left hand.   Time 8   Period Weeks   Status On-going   PT LONG TERM GOAL #5   Title Increase left shoulder strength to a solid 4/5 to increase stability for performance of functional activities   Time 8   Period Weeks   Status On-going               Plan - 02/06/16 1112    Clinical Impression Statement Patient continues to progress and tolerate treatment very well. Patient has reported less referred pain in elbow and hand. Patient  has been performing self scar tissue massage to break up scar adhesions. Goals ongoing at this time due to protocol limitations.    Rehab Potential Good   PT Frequency 2x / week   PT Duration 8 weeks   PT Treatment/Interventions ADLs/Self Care Home Management;Electrical Stimulation;Moist Heat;Therapeutic exercise;Therapeutic activities;Neuromuscular re-education;Manual techniques;Passive range of motion   PT Next Visit Plan Gentle left shoulder PROM:  MD order:  PROM to 90 degrees at this time until further instruction from MD. (Dr. Jonnie Kind 02/08/16)   Consulted and Agree with Plan of Care Patient      Patient will benefit from skilled therapeutic intervention in order to improve the following deficits and impairments:  Pain, Decreased activity tolerance, Decreased range of motion  Visit Diagnosis: Pain in left shoulder  Stiffness of left shoulder, not elsewhere classified     Problem List There are no active problems to display for this patient.   [place], [place] , MPT 02/06/2016,  5:34 PM  [facility] [address] [place], [place], [address] Phone: [phone]   Fax:  [phone]  Name: Helen Shannon MRN: [id number] Date of Birth: 1971-09-02

## 2016-02-09 ENCOUNTER — Ambulatory Visit: Payer: BLUE CROSS/BLUE SHIELD | Admitting: *Deleted

## 2016-02-09 DIAGNOSIS — M25612 Stiffness of left shoulder, not elsewhere classified: Secondary | ICD-10-CM

## 2016-02-09 DIAGNOSIS — M25512 Pain in left shoulder: Secondary | ICD-10-CM | POA: Diagnosis not present

## 2016-02-09 NOTE — Therapy (Signed)
[facility] [address] [place], [place], [address] Phone: [phone]   Fax:  [phone]  Physical Therapy Treatment  Patient Details  Name: Helen Shannon MRN: [id number] Date of Birth: 12/18/1970 Referring Provider: Annabelle Harmanana Bifolck PA-C.  Encounter Date: 02/09/2016      PT End of Session - 02/09/16 1500    Visit Number 7   Number of Visits 18   Date for PT Re-Evaluation 03/19/16  NO to cont PT x 5 weeks   PT Start Time 1345   PT Stop Time 1439   PT Time Calculation (min) 54 min      Past Medical History  Diagnosis Date  . Fluid retention     No past surgical history on file.  There were no vitals filed for this visit.      Subjective Assessment - 02/09/16 1358    Subjective Went to MD yesterday and MD said Xrays look good and there are no ROM restrictions now and BTW in 2 weeks   Patient Stated Goals Use my left shoulder again.   Currently in Pain? Yes   Pain Score 3    Pain Location Shoulder   Pain Orientation Left   Pain Descriptors / Indicators Aching;Sore   Pain Type Surgical pain   Pain Onset More than a month ago   Pain Frequency Constant                         [facility] Adult PT Treatment/Exercise - 02/09/16 0001    Exercises   Exercises Shoulder   Shoulder Exercises: Supine   Other Supine Exercises supine cane press and flexion 3x10 for HEP   Shoulder Exercises: Isometric Strengthening   External Rotation 5X10"   Internal Rotation 5X10"   Modalities   Modalities Electrical Stimulation;Cryotherapy   Cryotherapy   Number Minutes Cryotherapy 15 Minutes   Cryotherapy Location Shoulder   Type of Cryotherapy Ice pack   Electrical Stimulation   Electrical Stimulation Location L shoulder IFC 1-10hz  x 15 mins   Electrical Stimulation Goals Pain   Manual Therapy   Manual Therapy Passive ROM   Passive ROM Gentle  AAROM / PROM of L shoulder into flexion and into. ER with frequent oscillations  for relaxation. Rhythmic stabs ER/IR                 PT Education - 02/09/16 1510    Education provided Yes   Education Details supine cane, ER/IR isometrics   Person(s) Educated Patient   Methods Explanation;Demonstration;Tactile cues;Verbal cues   Comprehension Verbalized understanding;Returned demonstration             PT Long Term Goals - 02/01/16 1426    PT LONG TERM GOAL #1   Title Ind with a HEP.   Time 8   Period Weeks   Status On-going   PT LONG TERM GOAL #2   Title Active left shoulder flexion to 145 degrees so the patient can easily reach overhead   Time 8   Period Weeks   Status On-going   PT LONG TERM GOAL #3   Title Active ER to 70 degrees+ to allow for easily donning/doffing of apparel   Time 8   Period Weeks   Status On-going   PT LONG TERM GOAL #4   Title Increase ROM so patient is able to reach behind back to L4 with left hand.   Time 8   Period Weeks   Status On-going  PT LONG TERM GOAL #5   Title Increase left shoulder strength to a solid 4/5 to increase stability for performance of functional activities   Time 8   Period Weeks   Status On-going               Plan - 02/09/16 1504    Clinical Impression Statement Pt has a N.O to cont PT x 5 more weeks and no ROM limits. She did great today with AAROM and active ER. She was told not to try and raise LT UE in standing position due to compensatory motions. PROM to 100 degrees flexion and Er to 20 degrees today   Rehab Potential Good   PT Frequency 2x / week   PT Duration 8 weeks   PT Treatment/Interventions ADLs/Self Care Home Management;Electrical Stimulation;Moist Heat;Therapeutic exercise;Therapeutic activities;Neuromuscular re-education;Manual techniques;Passive range of motion   PT Next Visit Plan Gentle left shoulder PROM:  MD order:  PROM as tolerated now N.O.02-08-16  . (Dr. Jonnie Kind 02/08/16)   Consulted and Agree with Plan of Care Patient      Patient will benefit  from skilled therapeutic intervention in order to improve the following deficits and impairments:  Pain, Decreased activity tolerance, Decreased range of motion  Visit Diagnosis: Pain in left shoulder  Stiffness of left shoulder, not elsewhere classified     Problem List There are no active problems to display for this patient.   Auguste Tebbetts,CHRIS , PTA  02/09/2016, 4:28 PM  [facility] [address] [place], [place], [address] Phone: [phone]   Fax:  [phone]  Name: Helen Shannon MRN: [id number] Date of Birth: June 12, 1971

## 2016-02-13 ENCOUNTER — Ambulatory Visit: Payer: BLUE CROSS/BLUE SHIELD | Admitting: Physical Therapy

## 2016-02-13 DIAGNOSIS — M25512 Pain in left shoulder: Secondary | ICD-10-CM

## 2016-02-13 DIAGNOSIS — M25612 Stiffness of left shoulder, not elsewhere classified: Secondary | ICD-10-CM

## 2016-02-13 NOTE — Therapy (Signed)
[facility] [address] [place], [place], [address] Phone: [phone]   Fax:  [phone]  Physical Therapy Treatment  Patient Details  Name: Helen Shannon MRN: [id number] Date of Birth: 10/28/1970 Referring Provider: Annabelle Harmanana Bifolck PA-C.  Encounter Date: 02/13/2016      PT End of Session - 02/13/16 1621    Visit Number 8   Number of Visits 18   Date for PT Re-Evaluation 03/19/16   PT Start Time 0320   PT Stop Time 0429   PT Time Calculation (min) 69 min      Past Medical History  Diagnosis Date  . Fluid retention     No past surgical history on file.  There were no vitals filed for this visit.      Subjective Assessment - 02/13/16 1622    Subjective The Dr. was very pleased with my progress and how my scar looks.  He said I'm free do do just about anything within my pain limits.   Patient Stated Goals Use my left shoulder again.   Pain Score 3    Pain Location Shoulder   Pain Orientation Left   Pain Descriptors / Indicators Aching;Sore   Pain Type Surgical pain   Pain Onset More than a month ago                                      PT Long Term Goals - 02/01/16 1426    PT LONG TERM GOAL #1   Title Ind with a HEP.   Time 8   Period Weeks   Status On-going   PT LONG TERM GOAL #2   Title Active left shoulder flexion to 145 degrees so the patient can easily reach overhead   Time 8   Period Weeks   Status On-going   PT LONG TERM GOAL #3   Title Active ER to 70 degrees+ to allow for easily donning/doffing of apparel   Time 8   Period Weeks   Status On-going   PT LONG TERM GOAL #4   Title Increase ROM so patient is able to reach behind back to L4 with left hand.   Time 8   Period Weeks   Status On-going   PT LONG TERM GOAL #5   Title Increase left shoulder strength to a solid 4/5 to increase stability for performance of functional activities   Time 8   Period Weeks   Status  On-going             Patient will benefit from skilled therapeutic intervention in order to improve the following deficits and impairments:     Visit Diagnosis: Pain in left shoulder  Stiffness of left shoulder, not elsewhere classified     Problem List There are no active problems to display for this patient. TREATMENT:  Pulleys x 5 minutes f/b seated UE Ranger into flexion x 5 minutes.  1-1 left shoulder STW/M while receiving a low load stretch into left shoulder ER and continued PROM into flexion and ER (28 minutes) f/b IFC with cold pac x 17 minutes. [name], [place] MPT 02/13/2016, 4:29 PM  [facility] [address] [place], [place], [address] Phone: [phone]   Fax:  [phone]  Name: Helen Shannon MRN: [id number] Date of Birth: 03/21/1971

## 2016-02-15 ENCOUNTER — Encounter: Payer: Self-pay | Admitting: Physical Therapy

## 2016-02-15 ENCOUNTER — Ambulatory Visit: Payer: BLUE CROSS/BLUE SHIELD | Admitting: Physical Therapy

## 2016-02-15 DIAGNOSIS — M25512 Pain in left shoulder: Secondary | ICD-10-CM

## 2016-02-15 DIAGNOSIS — M25612 Stiffness of left shoulder, not elsewhere classified: Secondary | ICD-10-CM

## 2016-02-15 NOTE — Patient Instructions (Signed)
ROM: External / Internal Rotation - Wand   Holding wand with left hand palm up, push out from body with other hand, palm down. Keep both elbows bent. When stretch is felt, hold _5___ seconds. Repeat to other side, leading with same hand. Keep elbows bent. Repeat __10__ times per set. Do __2-3__ sets per session. Do __2__ sessions per day.  http://orth.exer.us/748   Copyright  VHI. All rights reserved.  ROM: Flexion - Wand (Supine)   Lie on back holding wand. Raise arms over head.  Repeat __10__ times per set. Do _2-3___ sets per session. Do _2___ sessions per day.  http://orth.exer.us/928   Copyright  VHI. All rights reserved.   

## 2016-02-15 NOTE — Therapy (Signed)
[facility] [address] [place], [place], [address] Phone: [phone]   Fax:  [phone]  Physical Therapy Treatment  Patient Details  Name: Helen Shannon MRN: [id number] Date of Birth: 10/31/1970 Referring Provider: Annabelle Harmanana Bifolck PA-C.  Encounter Date: 02/15/2016      PT End of Session - 02/15/16 1116    Visit Number 9   Number of Visits 18   Date for PT Re-Evaluation 03/19/16   PT Start Time 1033   PT Stop Time 1131   PT Time Calculation (min) 58 min   Activity Tolerance Patient tolerated treatment well   Behavior During Therapy [facility] for tasks assessed/performed      Past Medical History  Diagnosis Date  . Fluid retention     History reviewed. No pertinent past surgical history.  There were no vitals filed for this visit.      Subjective Assessment - 02/15/16 1053    Subjective Patient reported no complaints after last treatment   Patient Stated Goals Use my left shoulder again.   Currently in Pain? Yes   Pain Score 3    Pain Location Shoulder   Pain Orientation Left   Pain Descriptors / Indicators Aching;Sore   Pain Type Surgical pain   Pain Onset More than a month ago   Pain Frequency Constant   Aggravating Factors  first thing in AM   Pain Relieving Factors rest            [facility] PT Assessment - 02/15/16 0001    PROM   Overall PROM Comments PROM 25 ER, 123 flexion                     [facility] Adult PT Treatment/Exercise - 02/15/16 0001    Shoulder Exercises: Supine   Other Supine Exercises supine cane press/flexion and ER 3x10 for HEP   Shoulder Exercises: Standing   Other Standing Exercises wall walk x30   Shoulder Exercises: Pulleys   Flexion --  5min   Other Pulley Exercises standing UE ranger for elevation 3x10   Cryotherapy   Number Minutes Cryotherapy 15 Minutes   Cryotherapy Location Shoulder   Type of Cryotherapy Ice pack   Electrical Stimulation   Electrical Stimulation  Location left shoulder   Electrical Stimulation Action IFC   Electrical Stimulation Parameters 1-10hz    Electrical Stimulation Goals Pain   Manual Therapy   Manual Therapy Passive ROM   Passive ROM Gentle  AAROM / PROM of L shoulder into flexion and into. ER with frequent oscillations for relaxation. Rhythmic stabs ER/IR                 PT Education - 02/15/16 1101    Education Details HEP   Person(s) Educated Patient   Methods Explanation;Demonstration;Handout   Comprehension Verbalized understanding;Returned demonstration             PT Long Term Goals - 02/01/16 1426    PT LONG TERM GOAL #1   Title Ind with a HEP.   Time 8   Period Weeks   Status On-going   PT LONG TERM GOAL #2   Title Active left shoulder flexion to 145 degrees so the patient can easily reach overhead   Time 8   Period Weeks   Status On-going   PT LONG TERM GOAL #3   Title Active ER to 70 degrees+ to allow for easily donning/doffing of apparel   Time 8   Period Weeks   Status On-going  PT LONG TERM GOAL #4   Title Increase ROM so patient is able to reach behind back to L4 with left hand.   Time 8   Period Weeks   Status On-going   PT LONG TERM GOAL #5   Title Increase left shoulder strength to a solid 4/5 to increase stability for performance of functional activities   Time 8   Period Weeks   Status On-going               Plan - 02/15/16 1117    Clinical Impression Statement Patient progressing with al activities. Patient had no complaints with todays exercises and has improved PROM today. 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 Today HEP given for supine cane and thera putty.  Goals ongoing due to strength, ROM and pain deficits   Rehab Potential Good   PT Frequency 2x / week   PT Duration 8 weeks   PT Treatment/Interventions ADLs/Self Care Home Management;Electrical Stimulation;Moist Heat;Therapeutic exercise;Therapeutic  activities;Neuromuscular re-education;Manual techniques;Passive range of motion   PT Next Visit Plan cont with POC per MD/MPT   Consulted and Agree with Plan of Care Patient      Patient will benefit from skilled therapeutic intervention in order to improve the following deficits and impairments:  Pain, Decreased activity tolerance, Decreased range of motion  Visit Diagnosis: Pain in left shoulder  Stiffness of left shoulder, not elsewhere classified     Problem List There are no active problems to display for this patient.   Hermelinda Dellen, PTA 02/15/2016, 11:35 AM  [facility] [address] [place], [place], [address] Phone: [phone]   Fax:  [phone]  Name: Helen Shannon MRN: [id number] Date of Birth: [birth year]

## 2016-02-17 ENCOUNTER — Ambulatory Visit: Payer: BLUE CROSS/BLUE SHIELD | Admitting: Physical Therapy

## 2016-02-17 DIAGNOSIS — M25612 Stiffness of left shoulder, not elsewhere classified: Secondary | ICD-10-CM

## 2016-02-17 DIAGNOSIS — M25512 Pain in left shoulder: Secondary | ICD-10-CM

## 2016-02-17 NOTE — Therapy (Signed)
[facility] [address] [place], [place], [address] Phone: [phone]   Fax:  [phone]  Physical Therapy Treatment  Patient Details  Name: Helen Shannon MRN: [id number] Date of Birth: 03/27/1971 Referring Provider: Annabelle Harmanana Bifolck PA-C.  Encounter Date: 02/17/2016      PT End of Session - 02/17/16 1258    PT Start Time 1030   PT Stop Time 1128   PT Time Calculation (min) 58 min   Activity Tolerance Patient tolerated treatment well   Behavior During Therapy WFL for tasks assessed/performed      Past Medical History  Diagnosis Date  . Fluid retention     No past surgical history on file.  There were no vitals filed for this visit.      Subjective Assessment - 02/17/16 1257    Subjective No new complaints.   Patient Stated Goals Use my left shoulder again.   Pain Score 3    Pain Location Shoulder   Pain Orientation Left   Pain Descriptors / Indicators Aching;Sore   Pain Type Surgical pain   Pain Onset More than a month ago                                      PT Long Term Goals - 02/01/16 1426    PT LONG TERM GOAL #1   Title Ind with a HEP.   Time 8   Period Weeks   Status On-going   PT LONG TERM GOAL #2   Title Active left shoulder flexion to 145 degrees so the patient can easily reach overhead   Time 8   Period Weeks   Status On-going   PT LONG TERM GOAL #3   Title Active ER to 70 degrees+ to allow for easily donning/doffing of apparel   Time 8   Period Weeks   Status On-going   PT LONG TERM GOAL #4   Title Increase ROM so patient is able to reach behind back to L4 with left hand.   Time 8   Period Weeks   Status On-going   PT LONG TERM GOAL #5   Title Increase left shoulder strength to a solid 4/5 to increase stability for performance of functional activities   Time 8   Period Weeks   Status On-going             Patient will benefit from skilled therapeutic  intervention in order to improve the following deficits and impairments:     Visit Diagnosis: Pain in left shoulder  Stiffness of left shoulder, not elsewhere classified     Problem List There are no active problems to display for this patient.  Treatment:  Pulleys, UE Ranger and wall ladder (17 minutes total) f/b PROM; AAROM and Rhy Stabs in supine to patient's left shoulder x 21 minutes f/b IFC and CP x 15 minutes.  Patient did great today. APPLEGATE, [place] MPT 02/17/2016, 12:59 PM  [facility] [address] [place], [place], [address] Phone: [phone]   Fax:  [phone]  Name: Helen Shannon MRN: [id number] Date of Birth: 07/09/1971

## 2016-02-20 ENCOUNTER — Encounter: Payer: Self-pay | Admitting: Physical Therapy

## 2016-02-20 ENCOUNTER — Ambulatory Visit: Payer: BLUE CROSS/BLUE SHIELD | Admitting: Physical Therapy

## 2016-02-20 DIAGNOSIS — M25512 Pain in left shoulder: Secondary | ICD-10-CM

## 2016-02-20 DIAGNOSIS — M25612 Stiffness of left shoulder, not elsewhere classified: Secondary | ICD-10-CM

## 2016-02-20 NOTE — Therapy (Signed)
[facility] [address] [place], [place], [address] Phone: [phone]   Fax:  [phone]  Physical Therapy Treatment  Patient Details  Name: Helen Shannon MRN: [id number] Date of Birth: 09/15/1971 Referring Provider: Annabelle Harman Bifolck PA-C.  Encounter Date: 02/20/2016      PT End of Session - 02/20/16 1440    Visit Number 10   Number of Visits 18   Date for PT Re-Evaluation 03/19/16   PT Start Time 1433   PT Stop Time 1540   PT Time Calculation (min) 67 min   Activity Tolerance Patient tolerated treatment well   Behavior During Therapy [facility] for tasks assessed/performed      Past Medical History  Diagnosis Date  . Fluid retention     No past surgical history on file.  There were no vitals filed for this visit.      Subjective Assessment - 02/20/16 1440    Subjective States that she returns to work 02/27/2016. Reports tightness in upper arm.   Patient Stated Goals Use my left shoulder again.   Currently in Pain? No/denies            [facility] PT Assessment - 02/20/16 0001    Assessment   Medical Diagnosis Left proximal humerus fracture.   Onset Date/Surgical Date 01/06/16   Next MD Visit 03/2016   Restrictions   Weight Bearing Restrictions No                     [facility] Adult PT Treatment/Exercise - 02/20/16 0001    Shoulder Exercises: Supine   Protraction --   External Rotation AAROM;Left  3x10 reps   Flexion AAROM;Both  3x10 reps   Shoulder Exercises: Standing   Other Standing Exercises LUE wall ladder x2 min   Shoulder Exercises: Pulleys   Flexion Other (comment)  x5 min   Other Pulley Exercises standing UE ranger for elevation 3x10   Shoulder Exercises: ROM/Strengthening   Rhythmic Stabilization, Supine L shoulder rhythmic stabilizations in ER/IR, flex/ext x5 min to improve L shoulder stability   Modalities   Modalities Electrical Stimulation;Cryotherapy   Cryotherapy   Number Minutes  Cryotherapy 15 Minutes   Cryotherapy Location Shoulder   Type of Cryotherapy Ice pack   Electrical Stimulation   Electrical Stimulation Location L shoulder   Electrical Stimulation Action IFC   Electrical Stimulation Parameters 1-10 Hz x15 min   Electrical Stimulation Goals Pain   Manual Therapy   Manual Therapy Joint mobilization;Passive ROM;Myofascial release   Joint Mobilization G II-III L glenohumeral joint mobilization to improve L shoulder ROM and decrease pain   Myofascial Release MFR to L anterior and superior aspect of humerus to decrease tightness and pain associated with the region   Passive ROM Gentle PROM of L shoulder into flexion following glenohumeral joint mobilizations to emphasis L shoulder ROM                     PT Long Term Goals - 02/20/16 1442    PT LONG TERM GOAL #1   Title Ind with a HEP.   Time 8   Period Weeks   Status Achieved   PT LONG TERM GOAL #2   Title Active left shoulder flexion to 145 degrees so the patient can easily reach overhead   Time 8   Period Weeks   Status On-going   PT LONG TERM GOAL #3   Title Active ER to 70 degrees+ to allow for easily donning/doffing of  apparel   Time 8   Period Weeks   Status On-going   PT LONG TERM GOAL #4   Title Increase ROM so patient is able to reach behind back to L4 with left hand.   Time 8   Period Weeks   Status On-going   PT LONG TERM GOAL #5   Title Increase left shoulder strength to a solid 4/5 to increase stability for performance of functional activities   Time 8   Period Weeks   Status On-going               Plan - 02/20/16 1455    Clinical Impression Statement Patient continues to complete treatments fairly well although she arrived at clinic with increased L upper arm and shoulder tightness. Patient completed all exercises fairly well although AAROM flexion was reported as tight and facial grimacing noted. AAROM ER of L shoulder also observed as limited today.  Patient tolerated rhythmic stabilization in ER/IR with fairly good stability but patient had to utilize R arm to maintain position for flex/ext rhythmic stabilization. Moderate increased tightness was noted with manual therapy to L anterior and superior humerus today. Patient tolerated grade II-III glenohumeral joint mobilizations well with no reports of pain or discomfort with the mobilizations in A/P/I followed by PROM of L shoulder into flexion. Normal modalties response noted following removal of the modalties. Patient reported that L shoulder felt "better" following today's treatment.   Rehab Potential Good   PT Frequency 2x / week   PT Duration 8 weeks   PT Treatment/Interventions ADLs/Self Care Home Management;Electrical Stimulation;Moist Heat;Therapeutic exercise;Therapeutic activities;Neuromuscular re-education;Manual techniques;Passive range of motion   PT Next Visit Plan Continue per MPT and MD discretion with modalties and manual therapy PRN for pain.   Consulted and Agree with Plan of Care Patient      Patient will benefit from skilled therapeutic intervention in order to improve the following deficits and impairments:  Pain, Decreased activity tolerance, Decreased range of motion  Visit Diagnosis: Pain in left shoulder  Stiffness of left shoulder, not elsewhere classified     Problem List There are no active problems to display for this patient.   Evelene CroonKelsey M Parsons, PTA 02/20/2016, 3:46 PM  [facility] [address] [place], [place], [address] Phone: [phone]   Fax:  [phone]  Name: Helen Shannon MRN: [id number] Date of Birth: 07/02/1971

## 2016-02-22 ENCOUNTER — Encounter: Payer: Self-pay | Admitting: Physical Therapy

## 2016-02-22 ENCOUNTER — Ambulatory Visit: Payer: BLUE CROSS/BLUE SHIELD | Admitting: Physical Therapy

## 2016-02-22 DIAGNOSIS — M25512 Pain in left shoulder: Secondary | ICD-10-CM

## 2016-02-22 DIAGNOSIS — M25612 Stiffness of left shoulder, not elsewhere classified: Secondary | ICD-10-CM

## 2016-02-22 NOTE — Therapy (Addendum)
[facility] [place], [place], [address] Phone: [phone]   Fax:  [phone]  Physical Therapy Treatment  Patient Details  Name: Helen Shannon MRN: [id number] Date of Birth: [birth year] Referring Provider: Hinton Dyer [place] PA-C.  Encounter Date: 02/22/2016      PT End of Session - 02/22/16 1035    Visit Number 12   Number of Visits 18   Date for PT Re-Evaluation 03/19/16   PT Start Time 1036   PT Stop Time 1130   PT Time Calculation (min) 54 min      Past Medical History  Diagnosis Date  . Fluid retention     No past surgical history on file.  There were no vitals filed for this visit.      Subjective Assessment - 02/22/16 1035    Subjective Reports that this is her last treatment as she returns to work 02/27/2016. Reports only stiffness in L shoulder this morning.   Patient Stated Goals Use my left shoulder again.   Currently in Pain? No/denies            [facility] PT Assessment - 02/22/16 0001    Assessment   Medical Diagnosis Left proximal humerus fracture.   Onset Date/Surgical Date 01/06/16   Next MD Visit 03/2016   Restrictions   Weight Bearing Restrictions No   ROM / Strength   AROM / PROM / Strength AROM   AROM   Overall AROM  Deficits   AROM Assessment Site Shoulder   Right/Left Shoulder Left   Left Shoulder Flexion 108 Degrees  AAROM in supine   Left Shoulder ABduction 99 Degrees   Left Shoulder Internal Rotation 55 Degrees   Left Shoulder External Rotation 30 Degrees                     [facility] Adult PT Treatment/Exercise - 02/22/16 0001    Shoulder Exercises: Supine   External Rotation AAROM;Left  3x10 reps   Flexion AAROM;Both  x30 reps   Shoulder Exercises: Standing   Other Standing Exercises LUE wall ladder x2 min   Shoulder Exercises: Pulleys   Flexion Other (comment)  x5 min   Other Pulley Exercises standing UE ranger for elevation 3x10   Modalities   Modalities  Electrical Stimulation;Cryotherapy   Cryotherapy   Number Minutes Cryotherapy 15 Minutes   Cryotherapy Location Shoulder   Type of Cryotherapy Ice pack   Electrical Stimulation   Electrical Stimulation Location L shoulder   Electrical Stimulation Action IFC   Electrical Stimulation Parameters 1-10 Hz x15 min   Electrical Stimulation Goals Pain   Manual Therapy   Manual Therapy Joint mobilization;Passive ROM   Joint Mobilization G II-III L glenohumeral joint mobilization to improve L shoulder ROM and decrease pain   Passive ROM Gentle PROM of L shoulder into flexion/ER following glenohumeral joint mobilizations to emphasis L shoulder ROM                     PT Long Term Goals - 02/22/16 1115    PT LONG TERM GOAL #1   Title Ind with a HEP.   Time 8   Period Weeks   Status Achieved   PT LONG TERM GOAL #2   Title Active left shoulder flexion to 145 degrees so the patient can easily reach overhead   Time 8   Period Weeks   Status Not Met  AAROM L shoulder flexion 108 deg in supine   PT  LONG TERM GOAL #3   Title Active ER to 70 degrees+ to allow for easily donning/doffing of apparel   Time 8   Period Weeks   Status Not Met  AROM L shoulder ER in supine 30 deg   PT LONG TERM GOAL #4   Title Increase ROM so patient is able to reach behind back to L4 with left hand.   Time 8   Period Weeks   Status On-going   PT LONG TERM GOAL #5   Title Increase left shoulder strength to a solid 4/5 to increase stability for performance of functional activities   Time 8   Period Weeks   Status Not Met               Plan - 02/22/16 1116    Clinical Impression Statement Patient reported to PT staff that today was her last treatment in clinic as she is returning to work 02/27/2016. Both patient's L shoulder ROM and strength is limited in both supine and against gravity. Patient continues to report stiffness in L shoulder and glenhumeral joint mobilizations were completed to  decrease the stiffness with no adverse report from patient. No ROM or strength goals were met during treatment. Normal modalties response noted following removal of the modalites.   Rehab Potential Good   PT Frequency 2x / week   PT Duration 8 weeks   PT Treatment/Interventions ADLs/Self Care Home Management;Electrical Stimulation;Moist Heat;Therapeutic exercise;Therapeutic activities;Neuromuscular re-education;Manual techniques;Passive range of motion   PT Next Visit Plan D/C summary required by MPT.   Consulted and Agree with Plan of Care Patient      Patient will benefit from skilled therapeutic intervention in order to improve the following deficits and impairments:  Pain, Decreased activity tolerance, Decreased range of motion  Visit Diagnosis: Pain in left shoulder  Stiffness of left shoulder, not elsewhere classified     Problem List There are no active problems to display for this patient.   Ahmed Prima, PTA 02/22/2016 11:35 AM  [facility] [address] [place], [place], [address] Phone: [phone]   Fax:  [phone]  Name: Helen Shannon MRN: [id number] Date of Birth: [birth year]  PHYSICAL THERAPY DISCHARGE SUMMARY  Visits from Start of Care: 12.  Current functional level related to goals / functional outcomes: See above.   Remaining deficits: Continued loss of left shoulder range of motion.   Education / Equipment: HEP. Plan: Patient agrees to discharge.  Patient goals were partially met. Patient is being discharged due to not returning since the last visit.  ?????        Mali Applegate MPT

## 2016-02-24 ENCOUNTER — Encounter: Payer: BLUE CROSS/BLUE SHIELD | Admitting: *Deleted

## 2016-07-19 ENCOUNTER — Other Ambulatory Visit: Payer: Self-pay

## 2016-07-19 MED ORDER — MOMETASONE FUROATE 0.1 % EX CREA: 1.0000 "application " | TOPICAL_CREAM | Freq: Two times a day (BID) | CUTANEOUS | 0 refills | Status: DC

## 2016-08-23 ENCOUNTER — Other Ambulatory Visit: Payer: Self-pay | Admitting: *Deleted

## 2016-08-23 MED ORDER — ALBUTEROL SULFATE 108 (90 BASE) MCG/ACT IN AEPB: 1.0000 | INHALATION_SPRAY | RESPIRATORY_TRACT | 1 refills | Status: DC

## 2016-08-23 NOTE — Telephone Encounter (Signed)
sent 

## 2016-08-23 NOTE — Telephone Encounter (Signed)
noted 

## 2016-10-09 ENCOUNTER — Other Ambulatory Visit: Payer: Self-pay | Admitting: *Deleted

## 2016-10-09 MED ORDER — ALBUTEROL SULFATE HFA 108 (90 BASE) MCG/ACT IN AERS: 2.0000 | INHALATION_SPRAY | RESPIRATORY_TRACT | 1 refills | Status: DC | PRN

## 2016-10-31 ENCOUNTER — Encounter: Payer: Self-pay | Admitting: Physician Assistant

## 2016-10-31 ENCOUNTER — Ambulatory Visit (INDEPENDENT_AMBULATORY_CARE_PROVIDER_SITE_OTHER): Payer: BLUE CROSS/BLUE SHIELD | Admitting: Physician Assistant

## 2016-10-31 VITALS — BP 95/71 | HR 79 | Temp 97.7°F | Ht 72.0 in | Wt 302.0 lb

## 2016-10-31 DIAGNOSIS — I10 Essential (primary) hypertension: Secondary | ICD-10-CM | POA: Diagnosis not present

## 2016-10-31 DIAGNOSIS — K219 Gastro-esophageal reflux disease without esophagitis: Secondary | ICD-10-CM

## 2016-10-31 DIAGNOSIS — F325 Major depressive disorder, single episode, in full remission: Secondary | ICD-10-CM | POA: Diagnosis not present

## 2016-10-31 DIAGNOSIS — Z0000 Encounter for general adult medical examination without abnormal findings: Secondary | ICD-10-CM

## 2016-10-31 DIAGNOSIS — F411 Generalized anxiety disorder: Secondary | ICD-10-CM | POA: Diagnosis not present

## 2016-10-31 DIAGNOSIS — R062 Wheezing: Secondary | ICD-10-CM | POA: Diagnosis not present

## 2016-10-31 DIAGNOSIS — G43809 Other migraine, not intractable, without status migrainosus: Secondary | ICD-10-CM | POA: Diagnosis not present

## 2016-10-31 MED ORDER — FUROSEMIDE 20 MG PO TABS: 20.0000 mg | ORAL_TABLET | Freq: Three times a day (TID) | ORAL | 3 refills | Status: DC

## 2016-10-31 MED ORDER — SUMATRIPTAN SUCCINATE 50 MG PO TABS: 50.0000 mg | ORAL_TABLET | ORAL | 3 refills | Status: AC | PRN

## 2016-10-31 MED ORDER — LISINOPRIL 5 MG PO TABS: 5.0000 mg | ORAL_TABLET | Freq: Every day | ORAL | 3 refills | Status: DC

## 2016-10-31 MED ORDER — DIAZEPAM 5 MG PO TABS: 5.0000 mg | ORAL_TABLET | Freq: Two times a day (BID) | ORAL | 5 refills | Status: DC | PRN

## 2016-10-31 MED ORDER — ALBUTEROL SULFATE HFA 108 (90 BASE) MCG/ACT IN AERS: 2.0000 | INHALATION_SPRAY | Freq: Four times a day (QID) | RESPIRATORY_TRACT | 2 refills | Status: DC | PRN

## 2016-10-31 MED ORDER — PROPRANOLOL HCL ER 80 MG PO CP24
80.0000 mg | ORAL_CAPSULE | Freq: Every day | ORAL | 3 refills | Status: DC
Start: 2016-10-31 — End: 2017-08-21

## 2016-10-31 MED ORDER — BUPROPION HCL ER (XL) 300 MG PO TB24: 300.0000 mg | ORAL_TABLET | Freq: Every day | ORAL | 3 refills | Status: DC

## 2016-10-31 MED ORDER — SUMATRIPTAN SUCCINATE 100 MG PO TABS: 100.0000 mg | ORAL_TABLET | ORAL | 3 refills | Status: AC | PRN

## 2016-10-31 NOTE — Patient Instructions (Signed)

## 2016-11-01 LAB — CMP14+EGFR
ALBUMIN: 4.1 g/dL (ref 3.5–5.5)
ALK PHOS: 101 IU/L (ref 39–117)
ALT: 10 IU/L (ref 0–32)
AST: 14 IU/L (ref 0–40)
Albumin/Globulin Ratio: 1.6 (ref 1.2–2.2)
BILIRUBIN TOTAL: 0.3 mg/dL (ref 0.0–1.2)
BUN / CREAT RATIO: 15 (ref 9–23)
BUN: 12 mg/dL (ref 6–24)
CHLORIDE: 97 mmol/L (ref 96–106)
CO2: 26 mmol/L (ref 18–29)
Calcium: 9.2 mg/dL (ref 8.7–10.2)
Creatinine, Ser: 0.81 mg/dL (ref 0.57–1.00)
GFR calc Af Amer: 101 mL/min/{1.73_m2} (ref >59)
GFR calc non Af Amer: 88 mL/min/{1.73_m2} (ref >59)
GLUCOSE: 94 mg/dL (ref 65–99)
Globulin, Total: 2.6 g/dL (ref 1.5–4.5)
Potassium: 4 mmol/L (ref 3.5–5.2)
Sodium: 141 mmol/L (ref 134–144)
Total Protein: 6.7 g/dL (ref 6.0–8.5)

## 2016-11-01 LAB — CBC WITH DIFFERENTIAL/PLATELET
BASOS ABS: 0 10*3/uL (ref 0.0–0.2)
Basos: 0 %
EOS (ABSOLUTE): 0.1 10*3/uL (ref 0.0–0.4)
Eos: 1 %
Hematocrit: 41.1 % (ref 34.0–46.6)
Hemoglobin: 14 g/dL (ref 11.1–15.9)
Immature Grans (Abs): 0 10*3/uL (ref 0.0–0.1)
Immature Granulocytes: 0 %
LYMPHS ABS: 2 10*3/uL (ref 0.7–3.1)
Lymphs: 26 %
MCH: 30.4 pg (ref 26.6–33.0)
MCHC: 34.1 g/dL (ref 31.5–35.7)
MCV: 89 fL (ref 79–97)
Monocytes Absolute: 0.5 10*3/uL (ref 0.1–0.9)
Monocytes: 6 %
NEUTROS ABS: 5.2 10*3/uL (ref 1.4–7.0)
Neutrophils: 67 %
PLATELETS: 264 10*3/uL (ref 150–379)
RBC: 4.61 x10E6/uL (ref 3.77–5.28)
RDW: 13.2 % (ref 12.3–15.4)
WBC: 7.8 10*3/uL (ref 3.4–10.8)

## 2016-11-01 LAB — LIPID PANEL
CHOLESTEROL TOTAL: 187 mg/dL (ref 100–199)
Chol/HDL Ratio: 4.7 ratio units — ABNORMAL HIGH (ref 0.0–4.4)
HDL: 40 mg/dL (ref >39)
LDL Calculated: 120 mg/dL — ABNORMAL HIGH (ref 0–99)
TRIGLYCERIDES: 133 mg/dL (ref 0–149)
VLDL CHOLESTEROL CAL: 27 mg/dL (ref 5–40)

## 2016-11-01 NOTE — Progress Notes (Signed)
BP 95/71   Pulse 79   Temp 97.7 F (36.5 C) (Oral)   Ht 6' (1.829 m)   Wt (!) 302 lb (137 kg)   BMI 40.96 kg/m    Subjective:    Patient ID: Helen Shannon, female    DOB: 1970-10-19, 46 y.o.   MRN: [id number]  Helen Shannon is a 46 y.o. female presenting on 10/31/2016 for Medication Refill; Hypertension; Depression; and Migraine  HPI Patient here to be established as new patient at [facility].  This patient is known to me from [facility]. This patient comes in for periodic recheck on medications and conditions. All medications are reviewed today. There are no reports of any problems with the medications. All of the medical conditions are reviewed and updated.  Lab work is reviewed and will be ordered as medically necessary. There are no new problems reported with today's visit.  Se has been well with migraine, hypertension, edema, depression, sporadic restrictive airway episodes.  Past Medical History:  Diagnosis Date  . Fluid retention   . Migraine    Relevant past medical, surgical, family and social history reviewed and updated as indicated. Interim medical history since our last visit reviewed. Allergies and medications reviewed and updated.   Data reviewed from any sources in EPIC.  Review of Systems  Constitutional: Negative.  Negative for activity change, fatigue and fever.  HENT: Negative.   Eyes: Negative.   Respiratory: Negative.  Negative for cough.   Cardiovascular: Negative.  Negative for chest pain.  Gastrointestinal: Negative.  Negative for abdominal pain.  Endocrine: Negative.   Genitourinary: Negative.  Negative for dysuria.  Musculoskeletal: Negative.   Skin: Negative.   Neurological: Negative.      Social History   Social History  . Marital status: Widowed    Spouse name: N/A  . Number of children: N/A  . Years of education: N/A   Occupational History  . Not on file.   Social History Main Topics  .  Smoking status: Former Smoker    Packs/day: 0.00  . Smokeless tobacco: Never Used  . Alcohol use No  . Drug use: No  . Sexual activity: Not on file   Other Topics Concern  . Not on file   Social History Narrative  . No narrative on file    Past Surgical History:  Procedure Laterality Date  . TUBAL LIGATION      History reviewed. No pertinent family history.  Allergies as of 10/31/2016      Reactions   Oxycodone Hives, Nausea And Vomiting, Swelling   Prunus Persica Swelling   Codeine Nausea And Vomiting      Medication List       Accurate as of 10/31/16 11:59 PM. Always use your most recent med list.          albuterol 108 (90 Base) MCG/ACT inhaler Commonly known as:  PROAIR HFA Inhale 2 puffs into the lungs every 6 (six) hours as needed for wheezing or shortness of breath.   buPROPion 300 MG 24 hr tablet Commonly known as:  WELLBUTRIN XL Take 1 tablet (300 mg total) by mouth daily.   diazepam 5 MG tablet Commonly known as:  VALIUM Take 1 tablet (5 mg total) by mouth 2 (two) times daily as needed.   furosemide 20 MG tablet Commonly known as:  LASIX Take 1 tablet (20 mg total) by mouth 3 (three) times daily.   lisinopril 5 MG tablet Commonly known as:  PRINIVIL,ZESTRIL Take 1 tablet (5 mg total) by mouth daily.   propranolol ER 80 MG 24 hr capsule Commonly known as:  INDERAL LA Take 1 capsule (80 mg total) by mouth daily.   SUMAtriptan 50 MG tablet Commonly known as:  IMITREX Take 1 tablet (50 mg total) by mouth every 2 (two) hours as needed.   SUMAtriptan 100 MG tablet Commonly known as:  IMITREX Take 1 tablet (100 mg total) by mouth every 2 (two) hours as needed.   terbinafine 250 MG tablet Commonly known as:  LAMISIL          Objective:    BP 95/71   Pulse 79   Temp 97.7 F (36.5 C) (Oral)   Ht 6' (1.829 m)   Wt (!) 302 lb (137 kg)   BMI 40.96 kg/m   Allergies  Allergen Reactions  . Oxycodone Hives, Nausea And Vomiting and Swelling   . Prunus Persica Swelling  . Codeine Nausea And Vomiting   Wt Readings from Last 3 Encounters:  10/31/16 (!) 302 lb (137 kg)    Physical Exam  Constitutional: She is oriented to person, place, and time. She appears well-developed and well-nourished.  HENT:  Head: Normocephalic and atraumatic.  Right Ear: Tympanic membrane, external ear and ear canal normal.  Left Ear: Tympanic membrane, external ear and ear canal normal.  Nose: Nose normal. No rhinorrhea.  Mouth/Throat: Oropharynx is clear and moist and mucous membranes are normal. No oropharyngeal exudate or posterior oropharyngeal erythema.  Eyes: Conjunctivae and EOM are normal. Pupils are equal, round, and reactive to light.  Neck: Normal range of motion. Neck supple.  Cardiovascular: Normal rate, regular rhythm, normal heart sounds and intact distal pulses.   Pulmonary/Chest: Effort normal and breath sounds normal.  Abdominal: Soft. Bowel sounds are normal.  Neurological: She is alert and oriented to person, place, and time. She has normal reflexes.  Skin: Skin is warm and dry. No rash noted.  Psychiatric: She has a normal mood and affect. Her behavior is normal. Judgment and thought content normal.  Nursing note and vitals reviewed.   Results for orders placed or performed in visit on 10/31/16  CBC with Differential/Platelet  Result Value Ref Range   WBC 7.8 3.4 - 10.8 x10E3/uL   RBC 4.61 3.77 - 5.28 x10E6/uL   Hemoglobin 14.0 11.1 - 15.9 g/dL   Hematocrit 41.1 34.0 - 46.6 %   MCV 89 79 - 97 fL   MCH 30.4 26.6 - 33.0 pg   MCHC 34.1 31.5 - 35.7 g/dL   RDW 13.2 12.3 - 15.4 %   Platelets 264 150 - 379 x10E3/uL   Neutrophils 67 Not Estab. %   Lymphs 26 Not Estab. %   Monocytes 6 Not Estab. %   Eos 1 Not Estab. %   Basos 0 Not Estab. %   Neutrophils Absolute 5.2 1.4 - 7.0 x10E3/uL   Lymphocytes Absolute 2.0 0.7 - 3.1 x10E3/uL   Monocytes Absolute 0.5 0.1 - 0.9 x10E3/uL   EOS (ABSOLUTE) 0.1 0.0 - 0.4 x10E3/uL    Basophils Absolute 0.0 0.0 - 0.2 x10E3/uL   Immature Granulocytes 0 Not Estab. %   Immature Grans (Abs) 0.0 0.0 - 0.1 x10E3/uL  CMP14+EGFR  Result Value Ref Range   Glucose 94 65 - 99 mg/dL   BUN 12 6 - 24 mg/dL   Creatinine, Ser 0.81 0.57 - 1.00 mg/dL   GFR calc non Af Amer 88 >59 mL/min/1.73   GFR calc Af Amer 101 >  59 mL/min/1.73   BUN/Creatinine Ratio 15 9 - 23   Sodium 141 134 - 144 mmol/L   Potassium 4.0 3.5 - 5.2 mmol/L   Chloride 97 96 - 106 mmol/L   CO2 26 18 - 29 mmol/L   Calcium 9.2 8.7 - 10.2 mg/dL   Total Protein 6.7 6.0 - 8.5 g/dL   Albumin 4.1 3.5 - 5.5 g/dL   Globulin, Total 2.6 1.5 - 4.5 g/dL   Albumin/Globulin Ratio 1.6 1.2 - 2.2   Bilirubin Total 0.3 0.0 - 1.2 mg/dL   Alkaline Phosphatase 101 39 - 117 IU/L   AST 14 0 - 40 IU/L   ALT 10 0 - 32 IU/L  Lipid panel  Result Value Ref Range   Cholesterol, Total 187 100 - 199 mg/dL   Triglycerides 133 0 - 149 mg/dL   HDL 40 >39 mg/dL   VLDL Cholesterol Cal 27 5 - 40 mg/dL   LDL Calculated 120 (H) 0 - 99 mg/dL   Chol/HDL Ratio 4.7 (H) 0.0 - 4.4 ratio units      Assessment & Plan:   1. Essential hypertension - lisinopril (PRINIVIL,ZESTRIL) 5 MG tablet; Take 1 tablet (5 mg total) by mouth daily.  Dispense: 90 tablet; Refill: 3 - furosemide (LASIX) 20 MG tablet; Take 1 tablet (20 mg total) by mouth 3 (three) times daily.  Dispense: 90 tablet; Refill: 3 - CBC with Differential/Platelet - CMP14+EGFR - Lipid panel  2. Other migraine without status migrainosus, not intractable - propranolol ER (INDERAL LA) 80 MG 24 hr capsule; Take 1 capsule (80 mg total) by mouth daily.  Dispense: 90 capsule; Refill: 3 - SUMAtriptan (IMITREX) 50 MG tablet; Take 1 tablet (50 mg total) by mouth every 2 (two) hours as needed.  Dispense: 30 tablet; Refill: 3 - SUMAtriptan (IMITREX) 100 MG tablet; Take 1 tablet (100 mg total) by mouth every 2 (two) hours as needed.  Dispense: 30 tablet; Refill: 3  3. Gastroesophageal reflux disease  without esophagitis - CBC with Differential/Platelet - CMP14+EGFR - Lipid panel  4. Major depressive disorder with single episode, in full remission ([place]) - buPROPion (WELLBUTRIN XL) 300 MG 24 hr tablet; Take 1 tablet (300 mg total) by mouth daily.  Dispense: 90 tablet; Refill: 3  5. Generalized anxiety disorder - diazepam (VALIUM) 5 MG tablet; Take 1 tablet (5 mg total) by mouth 2 (two) times daily as needed.  Dispense: 30 tablet; Refill: 5  6. Wheezing - albuterol (PROAIR HFA) 108 (90 Base) MCG/ACT inhaler; Inhale 2 puffs into the lungs every 6 (six) hours as needed for wheezing or shortness of breath.  Dispense: 2 Inhaler; Refill: 2  7. Well adult exam Not performed, listed for lab order - CBC with Differential/Platelet - CMP14+EGFR - Lipid panel   Continue all other maintenance medications as listed above. Educational handout given for health maintenance  Follow up plan: Return in about 6 months (around 04/30/2017) for recheck.  Terald Sleeper PA-C [facility] [address]  [place], [place] [address] [phone]   11/01/2016, 9:35 PM

## 2017-01-08 ENCOUNTER — Other Ambulatory Visit: Payer: Self-pay | Admitting: *Deleted

## 2017-01-08 MED ORDER — TERBINAFINE HCL 250 MG PO TABS: 250.0000 mg | ORAL_TABLET | Freq: Every day | ORAL | 3 refills | Status: DC

## 2017-05-14 ENCOUNTER — Other Ambulatory Visit: Payer: Self-pay | Admitting: Physician Assistant

## 2017-05-14 DIAGNOSIS — F411 Generalized anxiety disorder: Secondary | ICD-10-CM

## 2017-05-14 DIAGNOSIS — I10 Essential (primary) hypertension: Secondary | ICD-10-CM

## 2017-05-15 NOTE — Telephone Encounter (Signed)
Last seen 10/31/16  Lawanna KobusAngel  If approved [facility]

## 2017-05-15 NOTE — Telephone Encounter (Signed)
Called to [facility] 

## 2017-08-21 ENCOUNTER — Other Ambulatory Visit: Payer: Self-pay | Admitting: *Deleted

## 2017-08-21 DIAGNOSIS — G43809 Other migraine, not intractable, without status migrainosus: Secondary | ICD-10-CM

## 2017-08-21 MED ORDER — PROPRANOLOL HCL ER 80 MG PO CP24: 80.0000 mg | ORAL_CAPSULE | Freq: Every day | ORAL | 3 refills | Status: DC

## 2017-08-22 ENCOUNTER — Other Ambulatory Visit: Payer: Self-pay | Admitting: *Deleted

## 2017-08-22 DIAGNOSIS — R062 Wheezing: Secondary | ICD-10-CM

## 2017-08-22 MED ORDER — ALBUTEROL SULFATE HFA 108 (90 BASE) MCG/ACT IN AERS: 2.0000 | INHALATION_SPRAY | Freq: Four times a day (QID) | RESPIRATORY_TRACT | 2 refills | Status: AC | PRN

## 2017-12-25 ENCOUNTER — Ambulatory Visit: Payer: BLUE CROSS/BLUE SHIELD | Admitting: Physician Assistant

## 2017-12-30 ENCOUNTER — Encounter: Payer: Self-pay | Admitting: Physician Assistant

## 2017-12-30 ENCOUNTER — Ambulatory Visit (INDEPENDENT_AMBULATORY_CARE_PROVIDER_SITE_OTHER): Payer: 59 | Admitting: Physician Assistant

## 2017-12-30 VITALS — BP 125/85 | HR 92 | Temp 98.2°F | Ht 72.0 in | Wt 322.6 lb

## 2017-12-30 DIAGNOSIS — F325 Major depressive disorder, single episode, in full remission: Secondary | ICD-10-CM | POA: Diagnosis not present

## 2017-12-30 DIAGNOSIS — G43809 Other migraine, not intractable, without status migrainosus: Secondary | ICD-10-CM | POA: Diagnosis not present

## 2017-12-30 DIAGNOSIS — L920 Granuloma annulare: Secondary | ICD-10-CM

## 2017-12-30 LAB — BAYER DCA HB A1C WAIVED: HB A1C (BAYER DCA - WAIVED): 5.1 % (ref <7.0)

## 2017-12-30 MED ORDER — CLOBETASOL PROPIONATE 0.05 % EX CREA: 1.0000 "application " | TOPICAL_CREAM | Freq: Two times a day (BID) | CUTANEOUS | 2 refills | Status: AC

## 2017-12-30 MED ORDER — PROPRANOLOL HCL ER 60 MG PO CP24: 60.0000 mg | ORAL_CAPSULE | Freq: Every day | ORAL | 1 refills | Status: DC

## 2017-12-30 MED ORDER — BUPROPION HCL ER (XL) 150 MG PO TB24: 300.0000 mg | ORAL_TABLET | Freq: Every day | ORAL | 3 refills | Status: DC

## 2017-12-31 DIAGNOSIS — L920 Granuloma annulare: Secondary | ICD-10-CM | POA: Insufficient documentation

## 2017-12-31 DIAGNOSIS — F325 Major depressive disorder, single episode, in full remission: Secondary | ICD-10-CM | POA: Insufficient documentation

## 2017-12-31 LAB — CBC WITH DIFFERENTIAL/PLATELET
BASOS ABS: 0 10*3/uL (ref 0.0–0.2)
BASOS: 0 %
EOS (ABSOLUTE): 0.1 10*3/uL (ref 0.0–0.4)
Eos: 2 %
HEMOGLOBIN: 14.1 g/dL (ref 11.1–15.9)
Hematocrit: 41.8 % (ref 34.0–46.6)
IMMATURE GRANS (ABS): 0 10*3/uL (ref 0.0–0.1)
Immature Granulocytes: 0 %
LYMPHS: 31 %
Lymphocytes Absolute: 2.4 10*3/uL (ref 0.7–3.1)
MCH: 30.9 pg (ref 26.6–33.0)
MCHC: 33.7 g/dL (ref 31.5–35.7)
MCV: 92 fL (ref 79–97)
Monocytes Absolute: 0.4 10*3/uL (ref 0.1–0.9)
Monocytes: 5 %
NEUTROS ABS: 4.7 10*3/uL (ref 1.4–7.0)
Neutrophils: 62 %
Platelets: 275 10*3/uL (ref 150–379)
RBC: 4.56 x10E6/uL (ref 3.77–5.28)
RDW: 12.7 % (ref 12.3–15.4)
WBC: 7.6 10*3/uL (ref 3.4–10.8)

## 2017-12-31 LAB — CMP14+EGFR
ALBUMIN: 4.3 g/dL (ref 3.5–5.5)
ALT: 21 IU/L (ref 0–32)
AST: 21 IU/L (ref 0–40)
Albumin/Globulin Ratio: 1.7 (ref 1.2–2.2)
Alkaline Phosphatase: 99 IU/L (ref 39–117)
BILIRUBIN TOTAL: 0.2 mg/dL (ref 0.0–1.2)
BUN/Creatinine Ratio: 17 (ref 9–23)
BUN: 14 mg/dL (ref 6–24)
CHLORIDE: 101 mmol/L (ref 96–106)
CO2: 23 mmol/L (ref 20–29)
Calcium: 9.5 mg/dL (ref 8.7–10.2)
Creatinine, Ser: 0.83 mg/dL (ref 0.57–1.00)
GFR calc Af Amer: 97 mL/min/{1.73_m2} (ref >59)
GFR calc non Af Amer: 84 mL/min/{1.73_m2} (ref >59)
GLUCOSE: 98 mg/dL (ref 65–99)
Globulin, Total: 2.5 g/dL (ref 1.5–4.5)
POTASSIUM: 4 mmol/L (ref 3.5–5.2)
SODIUM: 141 mmol/L (ref 134–144)
Total Protein: 6.8 g/dL (ref 6.0–8.5)

## 2017-12-31 LAB — RPR: RPR: NONREACTIVE

## 2017-12-31 LAB — TSH: TSH: 2.84 u[IU]/mL (ref 0.450–4.500)

## 2017-12-31 NOTE — Progress Notes (Signed)
BP 125/85   Pulse 92   Temp 98.2 F (36.8 C) (Oral)   Ht 6' (1.829 m)   Wt (!) 322 lb 9.6 oz (146.3 kg)   BMI 43.75 kg/m    Subjective:    Patient ID: Helen Shannon, female    DOB: 1970-10-28, 47 y.o.   MRN: [id number]  HPI: Helen Shannon is a 48 y.o. female presenting on 12/30/2017 for Rash  She needs refills on medications for migraine and depression. Sh eis doing well with all of those.  Refills will be sent  Patient is also been having a significant rash for the past few months.  It started on the arms and legs they were red circles.  They got larger.  There was no significant itching.  They did not have a flaky border.  She was seen by dermatology and diagnosed with granuloma annulare.  She was tried on betamethasone.  She states it has not helped if anything they have gotten worse.  She has broken out with more.  We have had a long discussion about other causes of the rash we will draw blood today.  Past Medical History:  Diagnosis Date  . Fluid retention   . Migraine    Relevant past medical, surgical, family and social history reviewed and updated as indicated. Interim medical history since our last visit reviewed. Allergies and medications reviewed and updated. DATA REVIEWED: CHART IN EPIC  Family History reviewed for pertinent findings.  Review of Systems  Constitutional: Negative.  Negative for activity change, fatigue and fever.  HENT: Negative.   Eyes: Negative.   Respiratory: Negative.  Negative for cough.   Cardiovascular: Negative.  Negative for chest pain.  Gastrointestinal: Negative.  Negative for abdominal pain.  Endocrine: Negative.   Genitourinary: Negative.  Negative for dysuria.  Musculoskeletal: Negative.   Skin: Positive for color change and rash.  Neurological: Negative.     Allergies as of 12/30/2017      Reactions   Oxycodone Hives, Nausea And Vomiting, Swelling   Prunus Persica Swelling   Codeine Nausea And Vomiting      Medication  List        Accurate as of 12/30/17 11:59 PM. Always use your most recent med list.          albuterol 108 (90 Base) MCG/ACT inhaler Commonly known as:  PROAIR HFA Inhale 2 puffs every 6 (six) hours as needed into the lungs for wheezing or shortness of breath.   buPROPion 150 MG 24 hr tablet Commonly known as:  WELLBUTRIN XL Take 2 tablets (300 mg total) by mouth daily.   clobetasol cream 0.05 % Commonly known as:  TEMOVATE Apply 1 application topically 2 (two) times daily.   diazepam 5 MG tablet Commonly known as:  VALIUM TAKE ONE TABLET BY MOUTH TWICE DAILY AS NEEDED   furosemide 20 MG tablet Commonly known as:  LASIX TAKE ONE TABLET BY MOUTH THREE TIMES DAILY   lisinopril 5 MG tablet Commonly known as:  PRINIVIL,ZESTRIL Take 1 tablet (5 mg total) by mouth daily.   propranolol ER 60 MG 24 hr capsule Commonly known as:  INDERAL LA Take 1 capsule (60 mg total) by mouth daily.   SUMAtriptan 50 MG tablet Commonly known as:  IMITREX Take 1 tablet (50 mg total) by mouth every 2 (two) hours as needed.   SUMAtriptan 100 MG tablet Commonly known as:  IMITREX Take 1 tablet (100 mg total) by mouth every 2 (two) hours as needed.  Objective:    BP 125/85   Pulse 92   Temp 98.2 F (36.8 C) (Oral)   Ht 6' (1.829 m)   Wt (!) 322 lb 9.6 oz (146.3 kg)   BMI 43.75 kg/m   Allergies  Allergen Reactions  . Oxycodone Hives, Nausea And Vomiting and Swelling  . Prunus Persica Swelling  . Codeine Nausea And Vomiting    Wt Readings from Last 3 Encounters:  12/30/17 (!) 322 lb 9.6 oz (146.3 kg)  10/31/16 (!) 302 lb (137 kg)    Physical Exam  Constitutional: She is oriented to person, place, and time. She appears well-developed and well-nourished.  HENT:  Head: Normocephalic and atraumatic.  Eyes: Pupils are equal, round, and reactive to light. Conjunctivae and EOM are normal.  Cardiovascular: Normal rate, regular rhythm, normal heart sounds and intact distal  pulses.  Pulmonary/Chest: Effort normal and breath sounds normal.  Abdominal: Soft. Bowel sounds are normal.  Neurological: She is alert and oriented to person, place, and time. She has normal reflexes.  Skin: Skin is warm and dry. Rash noted. Rash is maculopapular.  Multiple round lesions on arms and legs of varying sizes and varying age.  There is some central clearing, no excoriation  Psychiatric: She has a normal mood and affect. Her behavior is normal. Judgment and thought content normal.  Nursing note and vitals reviewed.   Results for orders placed or performed in visit on 12/30/17  CBC with Differential/Platelet  Result Value Ref Range   WBC 7.6 3.4 - 10.8 x10E3/uL   RBC 4.56 3.77 - 5.28 x10E6/uL   Hemoglobin 14.1 11.1 - 15.9 g/dL   Hematocrit 41.8 34.0 - 46.6 %   MCV 92 79 - 97 fL   MCH 30.9 26.6 - 33.0 pg   MCHC 33.7 31.5 - 35.7 g/dL   RDW 12.7 12.3 - 15.4 %   Platelets 275 150 - 379 x10E3/uL   Neutrophils 62 Not Estab. %   Lymphs 31 Not Estab. %   Monocytes 5 Not Estab. %   Eos 2 Not Estab. %   Basos 0 Not Estab. %   Neutrophils Absolute 4.7 1.4 - 7.0 x10E3/uL   Lymphocytes Absolute 2.4 0.7 - 3.1 x10E3/uL   Monocytes Absolute 0.4 0.1 - 0.9 x10E3/uL   EOS (ABSOLUTE) 0.1 0.0 - 0.4 x10E3/uL   Basophils Absolute 0.0 0.0 - 0.2 x10E3/uL   Immature Granulocytes 0 Not Estab. %   Immature Grans (Abs) 0.0 0.0 - 0.1 x10E3/uL  CMP14+EGFR  Result Value Ref Range   Glucose 98 65 - 99 mg/dL   BUN 14 6 - 24 mg/dL   Creatinine, Ser 0.83 0.57 - 1.00 mg/dL   GFR calc non Af Amer 84 >59 mL/min/1.73   GFR calc Af Amer 97 >59 mL/min/1.73   BUN/Creatinine Ratio 17 9 - 23   Sodium 141 134 - 144 mmol/L   Potassium 4.0 3.5 - 5.2 mmol/L   Chloride 101 96 - 106 mmol/L   CO2 23 20 - 29 mmol/L   Calcium 9.5 8.7 - 10.2 mg/dL   Total Protein 6.8 6.0 - 8.5 g/dL   Albumin 4.3 3.5 - 5.5 g/dL   Globulin, Total 2.5 1.5 - 4.5 g/dL   Albumin/Globulin Ratio 1.7 1.2 - 2.2   Bilirubin Total 0.2  0.0 - 1.2 mg/dL   Alkaline Phosphatase 99 39 - 117 IU/L   AST 21 0 - 40 IU/L   ALT 21 0 - 32 IU/L  TSH  Result Value Ref  Range   TSH 2.840 0.450 - 4.500 uIU/mL  RPR  Result Value Ref Range   RPR Ser Ql Non Reactive Non Reactive  Bayer DCA Hb A1c Waived  Result Value Ref Range   Bayer DCA Hb A1c Waived 5.1 <7.0 %      Assessment & Plan:   1. Other migraine without status migrainosus, not intractable - propranolol ER (INDERAL LA) 60 MG 24 hr capsule; Take 1 capsule (60 mg total) by mouth daily.  Dispense: 90 capsule; Refill: 1  2. Major depressive disorder with single episode, in full remission ([place]) - buPROPion (WELLBUTRIN XL) 150 MG 24 hr tablet; Take 2 tablets (300 mg total) by mouth daily.  Dispense: 90 tablet; Refill: 3  3. Granuloma annulare - clobetasol cream (TEMOVATE) 0.05 %; Apply 1 application topically 2 (two) times daily.  Dispense: 60 g; Refill: 2 - CBC with Differential/Platelet - CMP14+EGFR - TSH - RPR - Bayer DCA Hb A1c Waived   Continue all other maintenance medications as listed above.  Follow up plan: Recheck if needed  Educational handout given for [place] PA-C [facility] [address]  [place], [place] [address] [phone]   12/31/2017, 8:50 AM

## 2017-12-31 NOTE — Progress Notes (Signed)
Use the medication I sent yesterday for a week or so. If nothing is working, can refer [facility]

## 2018-01-01 ENCOUNTER — Telehealth: Payer: Self-pay | Admitting: Physician Assistant

## 2018-01-01 DIAGNOSIS — F325 Major depressive disorder, single episode, in full remission: Secondary | ICD-10-CM

## 2018-01-01 DIAGNOSIS — G43809 Other migraine, not intractable, without status migrainosus: Secondary | ICD-10-CM

## 2018-01-01 MED ORDER — BUPROPION HCL ER (XL) 150 MG PO TB24: 300.0000 mg | ORAL_TABLET | Freq: Every day | ORAL | 3 refills | Status: DC

## 2018-01-01 MED ORDER — PROPRANOLOL HCL ER 60 MG PO CP24: 60.0000 mg | ORAL_CAPSULE | Freq: Every day | ORAL | 1 refills | Status: DC

## 2018-01-01 NOTE — Telephone Encounter (Signed)
Pt notified RXs sent into [facility] per pt request

## 2018-01-17 ENCOUNTER — Encounter: Payer: Self-pay | Admitting: Physician Assistant

## 2018-01-20 ENCOUNTER — Other Ambulatory Visit: Payer: Self-pay | Admitting: Physician Assistant

## 2018-01-20 DIAGNOSIS — I10 Essential (primary) hypertension: Secondary | ICD-10-CM

## 2018-01-20 DIAGNOSIS — F325 Major depressive disorder, single episode, in full remission: Secondary | ICD-10-CM

## 2018-01-20 MED ORDER — BUPROPION HCL ER (XL) 150 MG PO TB24: 300.0000 mg | ORAL_TABLET | Freq: Every day | ORAL | 3 refills | Status: AC

## 2018-01-20 MED ORDER — LISINOPRIL 5 MG PO TABS: 5.0000 mg | ORAL_TABLET | Freq: Every day | ORAL | 3 refills | Status: DC

## 2018-06-11 ENCOUNTER — Other Ambulatory Visit: Payer: Self-pay | Admitting: Physician Assistant

## 2018-06-11 ENCOUNTER — Encounter: Payer: Self-pay | Admitting: Physician Assistant

## 2018-06-11 MED ORDER — PANTOPRAZOLE SODIUM 20 MG PO TBEC: 20.0000 mg | DELAYED_RELEASE_TABLET | Freq: Every day | ORAL | 1 refills | Status: DC

## 2018-06-11 NOTE — Progress Notes (Signed)
Sent to [facility] first, Protonix 20 mg 1 daily.

## 2018-06-14 ENCOUNTER — Other Ambulatory Visit: Payer: Self-pay | Admitting: Physician Assistant

## 2018-06-14 DIAGNOSIS — G43809 Other migraine, not intractable, without status migrainosus: Secondary | ICD-10-CM

## 2018-06-18 ENCOUNTER — Other Ambulatory Visit: Payer: Self-pay | Admitting: *Deleted

## 2018-06-18 MED ORDER — PANTOPRAZOLE SODIUM 20 MG PO TBEC: 20.0000 mg | DELAYED_RELEASE_TABLET | Freq: Every day | ORAL | 0 refills | Status: DC

## 2018-06-24 ENCOUNTER — Encounter: Payer: Self-pay | Admitting: Physician Assistant

## 2018-06-25 MED ORDER — PANTOPRAZOLE SODIUM 20 MG PO TBEC: 20.0000 mg | DELAYED_RELEASE_TABLET | Freq: Two times a day (BID) | ORAL | 1 refills | Status: DC

## 2018-10-02 ENCOUNTER — Other Ambulatory Visit: Payer: Self-pay | Admitting: Physician Assistant

## 2018-10-02 DIAGNOSIS — G43809 Other migraine, not intractable, without status migrainosus: Secondary | ICD-10-CM

## 2018-10-02 DIAGNOSIS — I10 Essential (primary) hypertension: Secondary | ICD-10-CM

## 2018-10-03 NOTE — Telephone Encounter (Signed)
Last seen 01/20/18. Helen Shannon pt. No appt scheduled

## 2019-01-23 ENCOUNTER — Other Ambulatory Visit: Payer: Self-pay | Admitting: Physician Assistant

## 2019-01-23 DIAGNOSIS — G43809 Other migraine, not intractable, without status migrainosus: Secondary | ICD-10-CM

## 2019-01-23 DIAGNOSIS — F325 Major depressive disorder, single episode, in full remission: Secondary | ICD-10-CM

## 2019-01-23 DIAGNOSIS — I10 Essential (primary) hypertension: Secondary | ICD-10-CM

## 2019-02-02 ENCOUNTER — Other Ambulatory Visit: Payer: Self-pay | Admitting: Physician Assistant

## 2019-02-02 DIAGNOSIS — I10 Essential (primary) hypertension: Secondary | ICD-10-CM

## 2019-02-09 ENCOUNTER — Other Ambulatory Visit: Payer: Self-pay | Admitting: Physician Assistant

## 2019-02-09 DIAGNOSIS — I10 Essential (primary) hypertension: Secondary | ICD-10-CM

## 2023-06-28 ENCOUNTER — Other Ambulatory Visit (HOSPITAL_BASED_OUTPATIENT_CLINIC_OR_DEPARTMENT_OTHER): Payer: Self-pay

## 2023-06-28 MED ORDER — WEGOVY 0.25 MG/0.5ML ~~LOC~~ SOAJ
0.5000 mL | SUBCUTANEOUS | 0 refills | Status: AC
  Filled 2023-06-28 – 2023-07-22 (×3): qty 2, 28d supply, fill #0

## 2023-07-02 ENCOUNTER — Other Ambulatory Visit (HOSPITAL_BASED_OUTPATIENT_CLINIC_OR_DEPARTMENT_OTHER): Payer: Self-pay

## 2023-07-22 ENCOUNTER — Other Ambulatory Visit (HOSPITAL_BASED_OUTPATIENT_CLINIC_OR_DEPARTMENT_OTHER): Payer: Self-pay
